# Patient Record
Sex: Female | Born: 1950 | Race: White | Hispanic: No | Marital: Married | Smoking: Never smoker
Health system: Southern US, Community
[De-identification: ages and names within clinical notes are randomized; demographics above are authoritative.]

## PROBLEM LIST (undated history)

## (undated) DIAGNOSIS — M797 Fibromyalgia: Secondary | ICD-10-CM

## (undated) DIAGNOSIS — T7840XA Allergy, unspecified, initial encounter: Secondary | ICD-10-CM

## (undated) DIAGNOSIS — J45909 Unspecified asthma, uncomplicated: Secondary | ICD-10-CM

## (undated) DIAGNOSIS — N393 Stress incontinence (female) (male): Secondary | ICD-10-CM

## (undated) DIAGNOSIS — N200 Calculus of kidney: Secondary | ICD-10-CM

## (undated) DIAGNOSIS — L719 Rosacea, unspecified: Secondary | ICD-10-CM

## (undated) DIAGNOSIS — E0500 Thyrotoxicosis with diffuse goiter without thyrotoxic crisis or storm: Secondary | ICD-10-CM

## (undated) DIAGNOSIS — I10 Essential (primary) hypertension: Secondary | ICD-10-CM

## (undated) DIAGNOSIS — E785 Hyperlipidemia, unspecified: Secondary | ICD-10-CM

## (undated) DIAGNOSIS — G2581 Restless legs syndrome: Secondary | ICD-10-CM

## (undated) DIAGNOSIS — K589 Irritable bowel syndrome without diarrhea: Secondary | ICD-10-CM

## (undated) HISTORY — DX: Stress incontinence (female) (male): N39.3

## (undated) HISTORY — DX: Rosacea, unspecified: L71.9

## (undated) HISTORY — DX: Irritable bowel syndrome, unspecified: K58.9

## (undated) HISTORY — PX: CHOLECYSTECTOMY: SHX55

## (undated) HISTORY — DX: Essential (primary) hypertension: I10

## (undated) HISTORY — DX: Allergy, unspecified, initial encounter: T78.40XA

## (undated) HISTORY — DX: Fibromyalgia: M79.7

## (undated) HISTORY — DX: Restless legs syndrome: G25.81

## (undated) HISTORY — DX: Thyrotoxicosis with diffuse goiter without thyrotoxic crisis or storm: E05.00

## (undated) HISTORY — DX: Hyperlipidemia, unspecified: E78.5

## (undated) HISTORY — DX: Unspecified asthma, uncomplicated: J45.909

## (undated) HISTORY — DX: Calculus of kidney: N20.0

---

## 1975-03-06 HISTORY — PX: ABDOMINAL HYSTERECTOMY: SHX81

## 1991-03-06 HISTORY — PX: NECK SURGERY: SHX720

## 1999-09-11 ENCOUNTER — Encounter: Payer: Self-pay | Admitting: Family Medicine

## 1999-09-11 ENCOUNTER — Encounter: Admission: RE | Admit: 1999-09-11 | Discharge: 1999-09-11 | Payer: Self-pay | Admitting: Family Medicine

## 2000-03-05 DIAGNOSIS — M797 Fibromyalgia: Secondary | ICD-10-CM

## 2000-03-05 HISTORY — DX: Fibromyalgia: M79.7

## 2002-11-02 ENCOUNTER — Encounter: Payer: Self-pay | Admitting: Family Medicine

## 2002-11-02 ENCOUNTER — Encounter: Admission: RE | Admit: 2002-11-02 | Discharge: 2002-11-02 | Payer: Self-pay | Admitting: Family Medicine

## 2004-03-05 HISTORY — PX: CARPAL TUNNEL RELEASE: SHX101

## 2009-04-08 ENCOUNTER — Encounter: Admission: RE | Admit: 2009-04-08 | Discharge: 2009-04-08 | Payer: Self-pay | Admitting: Family Medicine

## 2011-02-11 ENCOUNTER — Ambulatory Visit (INDEPENDENT_AMBULATORY_CARE_PROVIDER_SITE_OTHER): Payer: 59

## 2011-02-11 DIAGNOSIS — J9801 Acute bronchospasm: Secondary | ICD-10-CM

## 2011-02-11 DIAGNOSIS — J301 Allergic rhinitis due to pollen: Secondary | ICD-10-CM

## 2012-03-31 ENCOUNTER — Ambulatory Visit (INDEPENDENT_AMBULATORY_CARE_PROVIDER_SITE_OTHER): Payer: 59 | Admitting: Family Medicine

## 2012-03-31 VITALS — BP 128/84 | HR 98 | Temp 99.9°F | Resp 16 | Ht 65.0 in | Wt 215.0 lb

## 2012-03-31 DIAGNOSIS — J45909 Unspecified asthma, uncomplicated: Secondary | ICD-10-CM

## 2012-03-31 MED ORDER — BENZONATATE 200 MG PO CAPS: 200.0000 mg | ORAL_CAPSULE | Freq: Two times a day (BID) | ORAL | Status: DC | PRN

## 2012-03-31 MED ORDER — ALBUTEROL SULFATE HFA 108 (90 BASE) MCG/ACT IN AERS: 2.0000 | INHALATION_SPRAY | Freq: Four times a day (QID) | RESPIRATORY_TRACT | Status: DC | PRN

## 2012-03-31 MED ORDER — METHYLPREDNISOLONE 4 MG PO KIT: PACK | ORAL | Status: DC

## 2012-03-31 NOTE — Progress Notes (Signed)
62 yo woman with known asthma who has been experiencing a asthma flare for last 48 hours.  Patient has cough  And hoarseness.  Often  Can go 2-3 weeks without  Using the inhaler.  Works for [employer], has dog at home  Obj:  NAD, raspy voice. HEENT:  Upper plate, mild erythema; TM's neg NEck: supple, no adenopathy Chest:  Few faint wheezes  Assessment:  Asthmatic attack  1. Asthma  albuterol (PROVENTIL HFA;VENTOLIN HFA) 108 (90 BASE) MCG/ACT inhaler

## 2012-08-02 ENCOUNTER — Emergency Department: Payer: Self-pay | Admitting: Emergency Medicine

## 2012-08-02 LAB — CBC
HCT: 43.1 % (ref 35.0–47.0)
MCHC: 33.9 g/dL (ref 32.0–36.0)
MCV: 96 fL (ref 80–100)
RBC: 4.48 10*6/uL (ref 3.80–5.20)
RDW: 12.7 % (ref 11.5–14.5)
WBC: 8.7 10*3/uL (ref 3.6–11.0)

## 2012-08-02 LAB — URINALYSIS, COMPLETE: RBC,UR: 12 /HPF (ref 0–5)

## 2012-08-02 LAB — COMPREHENSIVE METABOLIC PANEL
Albumin: 3.6 g/dL (ref 3.4–5.0)
Alkaline Phosphatase: 68 U/L (ref 50–136)
BUN: 15 mg/dL (ref 7–18)
Bilirubin,Total: 0.3 mg/dL (ref 0.2–1.0)
Calcium, Total: 9.4 mg/dL (ref 8.5–10.1)
Chloride: 106 mmol/L (ref 98–107)
Co2: 28 mmol/L (ref 21–32)
Creatinine: 0.8 mg/dL (ref 0.60–1.30)
EGFR (African American): >60
EGFR (Non-African Amer.): >60
Glucose: 115 mg/dL — ABNORMAL HIGH (ref 65–99)
Osmolality: 283 (ref 275–301)
Potassium: 4.1 mmol/L (ref 3.5–5.1)
SGPT (ALT): 41 U/L (ref 12–78)
Sodium: 141 mmol/L (ref 136–145)
Total Protein: 7.8 g/dL (ref 6.4–8.2)

## 2012-08-02 LAB — LIPASE, BLOOD: Lipase: 131 U/L (ref 73–393)

## 2012-09-17 ENCOUNTER — Ambulatory Visit: Payer: Self-pay | Admitting: Surgery

## 2012-09-17 LAB — BASIC METABOLIC PANEL
BUN: 12 mg/dL (ref 7–18)
Calcium, Total: 9.4 mg/dL (ref 8.5–10.1)
Chloride: 106 mmol/L (ref 98–107)
Creatinine: 0.94 mg/dL (ref 0.60–1.30)
EGFR (African American): >60
EGFR (Non-African Amer.): >60
Glucose: 99 mg/dL (ref 65–99)
Osmolality: 281 (ref 275–301)
Potassium: 3.9 mmol/L (ref 3.5–5.1)

## 2012-09-17 LAB — CBC WITH DIFFERENTIAL/PLATELET
Basophil #: 0.1 10*3/uL (ref 0.0–0.1)
Eosinophil #: 0.2 10*3/uL (ref 0.0–0.7)
Eosinophil %: 2.9 %
HCT: 42 % (ref 35.0–47.0)
Lymphocyte #: 2 10*3/uL (ref 1.0–3.6)
Lymphocyte %: 35.9 %
MCHC: 34.8 g/dL (ref 32.0–36.0)
Monocyte #: 0.7 x10 3/mm (ref 0.2–0.9)
Neutrophil #: 2.7 10*3/uL (ref 1.4–6.5)
Neutrophil %: 47.7 %
RDW: 12.9 % (ref 11.5–14.5)
WBC: 5.6 10*3/uL (ref 3.6–11.0)

## 2012-09-17 LAB — HEPATIC FUNCTION PANEL A (ARMC)
Albumin: 3.5 g/dL (ref 3.4–5.0)
Alkaline Phosphatase: 72 U/L (ref 50–136)
Bilirubin, Direct: <0.1 mg/dL (ref 0.00–0.20)
Bilirubin,Total: 0.4 mg/dL (ref 0.2–1.0)
SGPT (ALT): 40 U/L (ref 12–78)

## 2012-09-18 LAB — PATHOLOGY REPORT

## 2013-07-12 ENCOUNTER — Ambulatory Visit: Payer: 59 | Admitting: Family Medicine

## 2013-07-12 ENCOUNTER — Ambulatory Visit: Payer: 59

## 2013-07-12 VITALS — BP 162/98 | HR 79 | Temp 98.1°F | Resp 15 | Ht 66.0 in | Wt 215.8 lb

## 2013-07-12 DIAGNOSIS — R0602 Shortness of breath: Secondary | ICD-10-CM

## 2013-07-12 DIAGNOSIS — J45901 Unspecified asthma with (acute) exacerbation: Secondary | ICD-10-CM

## 2013-07-12 DIAGNOSIS — J309 Allergic rhinitis, unspecified: Secondary | ICD-10-CM

## 2013-07-12 DIAGNOSIS — J45909 Unspecified asthma, uncomplicated: Secondary | ICD-10-CM

## 2013-07-12 LAB — POCT CBC
Granulocyte percent: 50.9 %G (ref 37–80)
HCT, POC: 45.7 % (ref 37.7–47.9)
Hemoglobin: 14.9 g/dL (ref 12.2–16.2)
Lymph, poc: 2.9 (ref 0.6–3.4)
MCH, POC: 32.5 pg — AB (ref 27–31.2)
MCHC: 32.6 g/dL (ref 31.8–35.4)
MCV: 99.8 fL — AB (ref 80–97)
MID (cbc): 0.6 (ref 0–0.9)
MPV: 9.6 fL (ref 0–99.8)
POC Granulocyte: 3.6 (ref 2–6.9)
POC LYMPH PERCENT: 40.3 %L (ref 10–50)
POC MID %: 8.8 %M (ref 0–12)
Platelet Count, POC: 246 10*3/uL (ref 142–424)
RBC: 4.58 M/uL (ref 4.04–5.48)
RDW, POC: 13.5 %
WBC: 7.1 10*3/uL (ref 4.6–10.2)

## 2013-07-12 MED ORDER — ALBUTEROL SULFATE (2.5 MG/3ML) 0.083% IN NEBU
2.5000 mg | INHALATION_SOLUTION | Freq: Once | RESPIRATORY_TRACT | Status: AC
  Administered 2013-07-12: 2.5 mg via RESPIRATORY_TRACT

## 2013-07-12 MED ORDER — FLUTICASONE PROPIONATE 50 MCG/ACT NA SUSP: 2.0000 | Freq: Every day | NASAL | Status: DC

## 2013-07-12 MED ORDER — METHYLPREDNISOLONE ACETATE 80 MG/ML IJ SUSP: 80.0000 mg | Freq: Once | INTRAMUSCULAR | Status: DC

## 2013-07-12 MED ORDER — IPRATROPIUM BROMIDE 0.02 % IN SOLN
0.5000 mg | Freq: Once | RESPIRATORY_TRACT | Status: AC
  Administered 2013-07-12: 0.5 mg via RESPIRATORY_TRACT

## 2013-07-12 MED ORDER — METHYLPREDNISOLONE ACETATE 80 MG/ML IJ SUSP
120.0000 mg | Freq: Once | INTRAMUSCULAR | Status: AC
  Administered 2013-07-12: 120 mg via INTRAMUSCULAR

## 2013-07-12 MED ORDER — MONTELUKAST SODIUM 10 MG PO TABS: 10.0000 mg | ORAL_TABLET | Freq: Every day | ORAL | Status: DC

## 2013-07-12 MED ORDER — AZITHROMYCIN 250 MG PO TABS: ORAL_TABLET | ORAL | Status: DC

## 2013-07-12 MED ORDER — BENZONATATE 200 MG PO CAPS: 200.0000 mg | ORAL_CAPSULE | Freq: Three times a day (TID) | ORAL | Status: DC | PRN

## 2013-07-12 MED ORDER — PREDNISONE 20 MG PO TABS: ORAL_TABLET | ORAL | Status: DC

## 2013-07-12 MED ORDER — ALBUTEROL SULFATE HFA 108 (90 BASE) MCG/ACT IN AERS: 2.0000 | INHALATION_SPRAY | Freq: Four times a day (QID) | RESPIRATORY_TRACT | Status: DC | PRN

## 2013-07-12 NOTE — Patient Instructions (Signed)
Asthma, Adult Asthma is a recurring condition in which the airways tighten and narrow. Asthma can make it difficult to breathe. It can cause coughing, wheezing, and shortness of breath. Asthma episodes (also called asthma attacks) range from minor to life-threatening. Asthma cannot be cured, but medicines and lifestyle changes can help control it. CAUSES Asthma is believed to be caused by inherited (genetic) and environmental factors, but its exact cause is unknown. Asthma may be triggered by allergens, lung infections, or irritants in the air. Asthma triggers are different for each person. Common triggers include:   Animal dander.  Dust mites.  Cockroaches.  Pollen from trees or grass.  Mold.  Smoke.  Air pollutants such as dust, household cleaners, hair sprays, aerosol sprays, paint fumes, strong chemicals, or strong odors.  Cold air, weather changes, and winds (which increase molds and pollens in the air).  Strong emotional expressions such as crying or laughing hard.  Stress.  Certain medicines (such as aspirin) or types of drugs (such as beta-blockers).  Sulfites in foods and drinks. Foods and drinks that may contain sulfites include dried fruit, potato chips, and sparkling grape juice.  Infections or inflammatory conditions such as the flu, a cold, or an inflammation of the nasal membranes (rhinitis).  Gastroesophageal reflux disease (GERD).  Exercise or strenuous activity. SYMPTOMS Symptoms may occur immediately after asthma is triggered or many hours later. Symptoms include:  Wheezing.  Excessive nighttime or early morning coughing.  Frequent or severe coughing with a common cold.  Chest tightness.  Shortness of breath. DIAGNOSIS  The diagnosis of asthma is made by a review of your medical history and a physical exam. Tests may also be performed. These may include:  Lung function studies. These tests show how much air you breath in and out.  Allergy  tests.  Imaging tests such as X-rays. TREATMENT  Asthma cannot be cured, but it can usually be controlled. Treatment involves identifying and avoiding your asthma triggers. It also involves medicines. There are 2 classes of medicine used for asthma treatment:   Controller medicines. These prevent asthma symptoms from occurring. They are usually taken every day.  Reliever or rescue medicines. These quickly relieve asthma symptoms. They are used as needed and provide short-term relief. Your health care provider will help you create an asthma action plan. An asthma action plan is a written plan for managing and treating your asthma attacks. It includes a list of your asthma triggers and how they may be avoided. It also includes information on when medicines should be taken and when their dosage should be changed. An action plan may also involve the use of a device called a peak flow meter. A peak flow meter measures how well the lungs are working. It helps you monitor your condition. HOME CARE INSTRUCTIONS   Take medicine as directed by your health care provider. Speak with your health care provider if you have questions about how or when to take the medicines.  Use a peak flow meter as directed by your health care provider. Record and keep track of readings.  Understand and use the action plan to help minimize or stop an asthma attack without needing to seek medical care.  Control your home environment in the following ways to help prevent asthma attacks:  Do not smoke. Avoid being exposed to secondhand smoke.  Change your heating and air conditioning filter regularly.  Limit your use of fireplaces and wood stoves.  Get rid of pests (such as roaches and   mice) and their droppings.  Throw away plants if you see mold on them.  Clean your floors and dust regularly. Use unscented cleaning products.  Try to have someone else vacuum for you regularly. Stay out of rooms while they are being  vacuumed and for a short while afterward. If you vacuum, use a dust mask from a hardware store, a double-layered or microfilter vacuum cleaner bag, or a vacuum cleaner with a HEPA filter.  Replace carpet with wood, tile, or vinyl flooring. Carpet can trap dander and dust.  Use allergy-proof pillows, mattress covers, and box spring covers.  Wash bed sheets and blankets every week in hot water and dry them in a dryer.  Use blankets that are made of polyester or cotton.  Clean bathrooms and kitchens with bleach. If possible, have someone repaint the walls in these rooms with mold-resistant paint. Keep out of the rooms that are being cleaned and painted.  Wash hands frequently. SEEK MEDICAL CARE IF:   You have wheezing, shortness of breath, or a cough even if taking medicine to prevent attacks.  The colored mucus you cough up (sputum) is thicker than usual.  Your sputum changes from clear or white to yellow, green, gray, or bloody.  You have any problems that may be related to the medicines you are taking (such as a rash, itching, swelling, or trouble breathing).  You are using a reliever medicine more than 2 3 times per week.  Your peak flow is still at 50 79% of you personal best after following your action plan for 1 hour. SEEK IMMEDIATE MEDICAL CARE IF:   You seem to be getting worse and are unresponsive to treatment during an asthma attack.  You are short of breath even at rest.  You get short of breath when doing very little physical activity.  You have difficulty eating, drinking, or talking due to asthma symptoms.  You develop chest pain.  You develop a fast heartbeat.  You have a bluish color to your lips or fingernails.  You are lightheaded, dizzy, or faint.  Your peak flow is less than 50% of your personal best.  You have a fever or persistent symptoms for more than 2 3 days.  You have a fever and symptoms suddenly get worse. MAKE SURE YOU:   Understand these  instructions.  Will watch your condition.  Will get help right away if you are not doing well or get worse. Document Released: 02/19/2005 Document Revised: 10/22/2012 Document Reviewed: 09/18/2012 [employer] Patient Information 2014 [employer].  

## 2013-07-12 NOTE — Progress Notes (Addendum)
Subjective:  This chart was scribed for Mindy SimmerKristi Fillmore Bynum, MD by Carl Bestelina Holson, Medical Scribe. This patient was seen in Room 2 and the patient's care was started at 10:08 AM.   Patient ID: Mindy Hood, female    DOB: 08/11/1950, 63 y.o.   MRN: [id number]  Asthma She complains of cough, shortness of breath and wheezing. Associated symptoms include ear pain, a fever, headaches and rhinorrhea. Her past medical history is significant for asthma.   HPI Comments: Mindy Hood is a 63 y.o. female who presents to the [facility] complaining of worsening asthmatic symptoms that started a four days ago.  The patient states that her symptoms started with rhinorrhea productive of clear mucous and itchy eyes.  She states that she went to an urgent care facility when her asthmatic symptoms started and was prescribed a seven day Prednisone taper.  She states that her symptoms have worsened since then and she is experiencing SOB and wheezing.  The patient states that she will experience headache when she starts coughing, tightness in her ears, and sore throat.  She states that she had a 99 degree fever.  She denies chills and diaphoresis as associated symptoms.  The patient states that she has taken 10 mg of Zyrtec for her allergies.  She denies using a nasal spray.  The patient states that her prescription for her albuterol inhaler has run out and she used her inhaler 5 times yesterday.  She states that she last used her inhaler at 4 AM this morning.  The patient states that non-fruit scented hand lotions, dust from construction, and cologne aggravate her allergies.  She denies having a history of smoking.  The patient denies having a history of Glaucoma.  She states that she works 12 hour days at [employer].  The patient's PCP is Dr. Kevan NyGates.   Past Medical History  Diagnosis Date  . Allergy   . Asthma   . Fibromyalgia 2002   Past Surgical History  Procedure Laterality Date  . Abdominal  hysterectomy    . Neck surgery  1993    2 disc surgery  . Carpal tunnel release  2006    right hand   Family History  Problem Relation Age of Onset  . Diverticulitis Sister   . Fibromyalgia Sister    History   Social History  . Marital Status: Married    Spouse Name: N/A    Number of Children: N/A  . Years of Education: N/A   Occupational History  . Not on file.   Social History Main Topics  . Smoking status: Never Smoker   . Smokeless tobacco: Not on file  . Alcohol Use: Yes     Comment: rarely  . Drug Use: No  . Sexual Activity: Not on file   Other Topics Concern  . Not on file   Social History Narrative  . No narrative on file   Allergies  Allergen Reactions  . Codeine Itching  . Penicillins Rash    Review of Systems  Constitutional: Positive for fever. Negative for chills and diaphoresis.  HENT: Positive for ear pain, rhinorrhea and voice change.   Eyes: Positive for itching.  Respiratory: Positive for cough, shortness of breath and wheezing.   Allergic/Immunologic: Positive for environmental allergies.  Neurological: Positive for headaches.  All other systems reviewed and are negative.    Objective:  Physical Exam  Constitutional: She is oriented to person, place, and time. She appears well-developed and  well-nourished. No distress.  HENT:  Head: Normocephalic and atraumatic.  Right Ear: Hearing, tympanic membrane, external ear and ear canal normal.  Left Ear: Hearing, tympanic membrane, external ear and ear canal normal.  Nose: Right sinus exhibits frontal sinus tenderness. Right sinus exhibits no maxillary sinus tenderness. Left sinus exhibits frontal sinus tenderness. Left sinus exhibits no maxillary sinus tenderness.  Mouth/Throat: Uvula is midline, oropharynx is clear and moist and mucous membranes are normal. No oropharyngeal exudate, posterior oropharyngeal edema or posterior oropharyngeal erythema.  Eyes: EOM are normal. Pupils are equal,  round, and reactive to light.  Conjunctiva diffusely erythematous.    Neck: Normal range of motion. Neck supple. No thyromegaly present.  Cardiovascular: Normal rate, regular rhythm, normal heart sounds and intact distal pulses.  Exam reveals no gallop and no friction rub.   No murmur heard. Pulmonary/Chest: Effort normal. No respiratory distress. She has wheezes. She has no rales.  Musculoskeletal: Normal range of motion.  Lymphadenopathy:    She has no cervical adenopathy.  Neurological: She is alert and oriented to person, place, and time.  Skin: Skin is warm and dry. No rash noted. She is not diaphoretic.  Psychiatric: She has a normal mood and affect. Her behavior is normal.  Nursing note and vitals reviewed.  Results for orders placed in visit on 07/12/13  POCT CBC      Result Value Ref Range   WBC 7.1  4.6 - 10.2 K/uL   Lymph, poc 2.9  0.6 - 3.4   POC LYMPH PERCENT 40.3  10 - 50 %L   MID (cbc) 0.6  0 - 0.9   POC MID % 8.8  0 - 12 %M   POC Granulocyte 3.6  2 - 6.9   Granulocyte percent 50.9  37 - 80 %G   RBC 4.58  4.04 - 5.48 M/uL   Hemoglobin 14.9  12.2 - 16.2 g/dL   HCT, POC 78.245.7  95.637.7 - 47.9 %   MCV 99.8 (*) 80 - 97 fL   MCH, POC 32.5 (*) 27 - 31.2 pg   MCHC 32.6  31.8 - 35.4 g/dL   RDW, POC 21.313.5     Platelet Count, POC 246  142 - 424 K/uL   MPV 9.6  0 - 99.8 fL   [facility] reading (PRIMARY) by  Dr. Katrinka BlazingSmith. CXR:  NAD.  ALBUTEROL/ATROVENT NEBULIZER ADMINISTERED.  DEPOMEDROL 120MG  IM ADMINISTERED.  PEAK FLOW PRE-NEB:  350 PEAK FLOW POST-NEB: 350   BP 162/98  Pulse 79  Temp(Src) 98.1 F (36.7 C) (Oral)  Resp 15  Ht 5\' 6"  (1.676 m)  Wt 215 lb 12.8 oz (97.886 kg)  BMI 34.85 kg/m2  SpO2 98% Assessment & Plan:  SOB (shortness of breath) - Plan: POCT CBC, DG Chest 2 View, albuterol (PROVENTIL) (2.5 MG/3ML) 0.083% nebulizer solution 2.5 mg, ipratropium (ATROVENT) nebulizer solution 0.5 mg, methylPREDNISolone acetate (DEPO-MEDROL) injection 120 mg, DISCONTINUED:  methylPREDNISolone acetate (DEPO-MEDROL) injection 80 mg  Asthma exacerbation - Plan: POCT CBC, DG Chest 2 View, albuterol (PROVENTIL) (2.5 MG/3ML) 0.083% nebulizer solution 2.5 mg, ipratropium (ATROVENT) nebulizer solution 0.5 mg, methylPREDNISolone acetate (DEPO-MEDROL) injection 120 mg, DISCONTINUED: methylPREDNISolone acetate (DEPO-MEDROL) injection 80 mg  Asthma - Plan: albuterol (PROVENTIL HFA;VENTOLIN HFA) 108 (90 BASE) MCG/ACT inhaler, benzonatate (TESSALON) 200 MG capsule  Allergic rhinitis, cause unspecified - Plan: POCT CBC, DG Chest 2 View, albuterol (PROVENTIL) (2.5 MG/3ML) 0.083% nebulizer solution 2.5 mg, ipratropium (ATROVENT) nebulizer solution 0.5 mg, DISCONTINUED: methylPREDNISolone acetate (DEPO-MEDROL) injection 80 mg   1.  SOB: New; secondary to asthma exacerbation; CXR normal. 2.  Asthma exacerbation:  New.  S/p nebulizer in office; s/p Depomedrol 120mg  IM in office; rx for Prednisone provided to continue; rx for Albuterol HFA provided to use qid scheduled for one week and then PRN.  RTC for acute worsening.  Rx for [employer] and Zithromax provided to cover any secondary infection. 3.  Allergic Rhinitis: uncontrolled; add Flonase and Singulair to oral antihistamine.   Meds ordered this encounter  Medications  . albuterol (PROVENTIL) (2.5 MG/3ML) 0.083% nebulizer solution 2.5 mg    Sig:   . ipratropium (ATROVENT) nebulizer solution 0.5 mg    Sig:   . DISCONTD: methylPREDNISolone acetate (DEPO-MEDROL) injection 80 mg    Sig:   . methylPREDNISolone acetate (DEPO-MEDROL) injection 120 mg    Sig:   . albuterol (PROVENTIL HFA;VENTOLIN HFA) 108 (90 BASE) MCG/ACT inhaler    Sig: Inhale 2 puffs into the lungs every 6 (six) hours as needed.    Dispense:  18 g    Refill:  11  . benzonatate (TESSALON) 200 MG capsule    Sig: Take 1 capsule (200 mg total) by mouth 3 (three) times daily as needed for cough.    Dispense:  60 capsule    Refill:  5  . fluticasone (FLONASE)  50 MCG/ACT nasal spray    Sig: Place 2 sprays into both nostrils daily.    Dispense:  16 g    Refill:  11  . montelukast (SINGULAIR) 10 MG tablet    Sig: Take 1 tablet (10 mg total) by mouth at bedtime.    Dispense:  30 tablet    Refill:  5  . predniSONE (DELTASONE) 20 MG tablet    Sig: Three tablets daily x 1 day then two tablets daily x 5 days then one tablet daily x 3 days    Dispense:  16 tablet    Refill:  0  . azithromycin (ZITHROMAX) 250 MG tablet    Sig: Two tablets daily x 1 day then one tablet daily x 4 days    Dispense:  6 tablet    Refill:  0   I personally performed the services described in this documentation, which was scribed in my presence.  The recorded information has been reviewed and is accurate.  Mindy Simmer, M.D.  [facility] [address] [place], [place]  [address] [phone] phone [phone] fax

## 2013-07-26 ENCOUNTER — Emergency Department: Payer: Self-pay | Admitting: Emergency Medicine

## 2013-07-26 LAB — URINALYSIS, COMPLETE
Bacteria: NONE SEEN
Bilirubin,UR: NEGATIVE
GLUCOSE, UR: NEGATIVE mg/dL (ref 0–75)
Ketone: NEGATIVE
Nitrite: NEGATIVE
Ph: 5 (ref 4.5–8.0)
Protein: 30
RBC,UR: 299 /HPF (ref 0–5)
SPECIFIC GRAVITY: 1.027 (ref 1.003–1.030)
WBC UR: 20 /HPF (ref 0–5)

## 2013-07-26 LAB — COMPREHENSIVE METABOLIC PANEL
ALBUMIN: 3.5 g/dL (ref 3.4–5.0)
Alkaline Phosphatase: 67 U/L
Anion Gap: 8 (ref 7–16)
BILIRUBIN TOTAL: 0.5 mg/dL (ref 0.2–1.0)
BUN: 18 mg/dL (ref 7–18)
CHLORIDE: 102 mmol/L (ref 98–107)
Calcium, Total: 9.6 mg/dL (ref 8.5–10.1)
Co2: 28 mmol/L (ref 21–32)
Creatinine: 1.04 mg/dL (ref 0.60–1.30)
EGFR (African American): >60
EGFR (Non-African Amer.): 58 — ABNORMAL LOW
GLUCOSE: 134 mg/dL — AB (ref 65–99)
OSMOLALITY: 280 (ref 275–301)
Potassium: 4.4 mmol/L (ref 3.5–5.1)
SGOT(AST): 23 U/L (ref 15–37)
SGPT (ALT): 50 U/L (ref 12–78)
Sodium: 138 mmol/L (ref 136–145)
Total Protein: 7.7 g/dL (ref 6.4–8.2)

## 2013-07-26 LAB — CBC
HCT: 45 % (ref 35.0–47.0)
HGB: 14.9 g/dL (ref 12.0–16.0)
MCH: 32.5 pg (ref 26.0–34.0)
MCHC: 33 g/dL (ref 32.0–36.0)
MCV: 98 fL (ref 80–100)
PLATELETS: 221 10*3/uL (ref 150–440)
RBC: 4.58 10*6/uL (ref 3.80–5.20)
RDW: 13.1 % (ref 11.5–14.5)
WBC: 9.8 10*3/uL (ref 3.6–11.0)

## 2014-02-22 ENCOUNTER — Ambulatory Visit (INDEPENDENT_AMBULATORY_CARE_PROVIDER_SITE_OTHER): Payer: 59 | Admitting: Physician Assistant

## 2014-02-22 VITALS — BP 148/102 | HR 97 | Temp 98.7°F | Resp 17 | Ht 66.5 in | Wt 218.0 lb

## 2014-02-22 DIAGNOSIS — Z889 Allergy status to unspecified drugs, medicaments and biological substances status: Secondary | ICD-10-CM

## 2014-02-22 DIAGNOSIS — R0602 Shortness of breath: Secondary | ICD-10-CM

## 2014-02-22 DIAGNOSIS — J4540 Moderate persistent asthma, uncomplicated: Secondary | ICD-10-CM

## 2014-02-22 DIAGNOSIS — R05 Cough: Secondary | ICD-10-CM

## 2014-02-22 DIAGNOSIS — R0981 Nasal congestion: Secondary | ICD-10-CM

## 2014-02-22 DIAGNOSIS — R059 Cough, unspecified: Secondary | ICD-10-CM

## 2014-02-22 DIAGNOSIS — Z9109 Other allergy status, other than to drugs and biological substances: Secondary | ICD-10-CM

## 2014-02-22 DIAGNOSIS — J4530 Mild persistent asthma, uncomplicated: Secondary | ICD-10-CM

## 2014-02-22 MED ORDER — BENZONATATE 200 MG PO CAPS: 200.0000 mg | ORAL_CAPSULE | Freq: Three times a day (TID) | ORAL | Status: DC | PRN

## 2014-02-22 MED ORDER — LEVALBUTEROL HCL 0.63 MG/3ML IN NEBU
0.6300 mg | INHALATION_SOLUTION | Freq: Once | RESPIRATORY_TRACT | Status: AC
  Administered 2014-02-22: 0.63 mg via RESPIRATORY_TRACT

## 2014-02-22 MED ORDER — CETIRIZINE HCL 10 MG PO TABS: 10.0000 mg | ORAL_TABLET | Freq: Every day | ORAL | Status: DC

## 2014-02-22 MED ORDER — EPINEPHRINE 0.3 MG/0.3ML IJ SOAJ: 0.3000 mg | Freq: Once | INTRAMUSCULAR | Status: DC

## 2014-02-22 MED ORDER — ALBUTEROL SULFATE HFA 108 (90 BASE) MCG/ACT IN AERS: 2.0000 | INHALATION_SPRAY | Freq: Four times a day (QID) | RESPIRATORY_TRACT | Status: DC | PRN

## 2014-02-22 MED ORDER — BECLOMETHASONE DIPROPIONATE 40 MCG/ACT IN AERS: 2.0000 | INHALATION_SPRAY | Freq: Two times a day (BID) | RESPIRATORY_TRACT | Status: DC

## 2014-02-22 MED ORDER — MUCINEX DM MAXIMUM STRENGTH 60-1200 MG PO TB12: 1.0000 | ORAL_TABLET | Freq: Two times a day (BID) | ORAL | Status: DC

## 2014-02-22 NOTE — Progress Notes (Signed)
MRN: [id number] DOB: 05/24/1950  Subjective:    Chief Complaint  Patient presents with  . Shortness of Breath  . Asthma    Mindy Hood is a 63 y.o. female presenting for 8 days one week of sob.  Exacerbated around early December/January.  She is not sure what started the sob.  She has not had upper respiratory symptoms of ear fullness, sorethroat (besides yesterday her throat was scratchy), sinus pressure, or fever.  She has no known sick contacts.  She has had associated coughing that she is taking benzonatate for relief, but at nighttime, her cough she will wake up with a cough.  Husband smokes only outdoors, and they use separate cars.  She states that this sob with cough has appeared every year for the last 3 years and may lasts into July.   Allergic triggers range from cologne, dust, lotions, nuts that cause her airway to close up and she must escape to the outdoors and wait for her airways to open.  This astma has not been followed by a pcp in years, and she mostly appears for complaints.  She states that she heats the home with a propane gas furnace.  She has a air purifier.  Mindy Hood has a current medication list which includes the following prescription(s): albuterol, benzonatate, and cetirizine.  She is allergic to codeine; penicillins; and phenergan.  Mindy Hood  has a past medical history of Allergy; Asthma; and Fibromyalgia (2002). Also  has past surgical history that includes Abdominal hysterectomy; Neck surgery (1993); and Carpal tunnel release (2006).  ROS As in subjective.  Objective:   Vitals: BP 148/102 mmHg  Pulse 97  Temp(Src) 98.7 F (37.1 C) (Oral)  Resp 17  Ht 5' 6.5" (1.689 m)  Wt 218 lb (98.884 kg)  BMI 34.66 kg/m2  SpO2 97%  Physical Exam  Constitutional: She is oriented to person, place, and time and well-developed, well-nourished, and in no distress. No distress.  HENT:  Head: Normocephalic and atraumatic. Head is without raccoon's eyes.  Right Ear:  Tympanic membrane, external ear and ear canal normal.  Left Ear: Tympanic membrane, external ear and ear canal normal.  Nose: Mucosal edema and rhinorrhea present.  Mouth/Throat: No uvula swelling. No oropharyngeal exudate, posterior oropharyngeal edema or posterior oropharyngeal erythema.  Pulmonary/Chest: Effort normal. No accessory muscle usage. No apnea. No respiratory distress. She has decreased breath sounds (slightly decreased breath sounds in the lower lobes). She has no wheezes. She has no rhonchi.  Lymphadenopathy:       Head (right side): No submental, no submandibular, no preauricular, no posterior auricular and no occipital adenopathy present.       Head (left side): No submental, no submandibular, no preauricular, no posterior auricular and no occipital adenopathy present.       Right cervical: No superficial cervical and no posterior cervical adenopathy present.      Left cervical: No superficial cervical and no posterior cervical adenopathy present.       Right: No supraclavicular adenopathy present.       Left: No supraclavicular adenopathy present.  Neurological: She is oriented to person, place, and time.  Psychiatric: Mood, memory, affect and judgment normal.    Prediction:441  Pre-nebulizer treatment: 397 (90%) Post-nebulizer treatment: 440   Post-nebulizer: O2 Sat 98%     Assessment and Plan :  63 year old female is here today for sob and cough. Allergies are not well controlled, and exacerbating her asthma.  Increasing her daily zyrtec to  10mg .   She is also in need of increasing her asthma regimen to daily maintenance along with symptomatic inhaler.  Starting the qvar.  She will rtc in 1 month and establish care with pcp here, as well as follow up.  Due to her hx of acute allergic response to triggers, I am ordering her an epipen.    SOB (shortness of breath) - Plan: levalbuterol (XOPENEX) nebulizer solution 0.63 mg, beclomethasone (QVAR) 40 MCG/ACT  inhaler  Nasal congestion - Plan: Dextromethorphan-Guaifenesin (MUCINEX DM MAXIMUM STRENGTH) 60-1200 MG TB12  Cough - Plan: Dextromethorphan-Guaifenesin (MUCINEX DM MAXIMUM STRENGTH) 60-1200 MG TB12  Asthma, moderate persistent, uncomplicated - Plan: benzonatate (TESSALON) 200 MG capsule  Multiple allergies - Plan: cetirizine (ZYRTEC) 10 MG tablet, EPINEPHrine 0.3 mg/0.3 mL IJ SOAJ injection  Asthma, mild persistent, uncomplicated - Plan: albuterol (PROVENTIL HFA;VENTOLIN HFA) 108 (90 BASE) MCG/ACT inhaler  [name], PA-C [facility] 12/21/20159:31 PM

## 2014-02-22 NOTE — Patient Instructions (Addendum)
Do the qvar 2 puffs, twice per day.  Take the albuterol when your symptoms of shortness of breath do not resolve.  You can continue taking the flonase, until the nasal congestion resolves.   Change to taking the zyrtec at 10mg  dosage Recheck your BP at home, and let us know what it is when you return in 4 weeks. Please return if the shortness of breath is not helped with this regimen.    Epinephrine injection (Auto-injector) What is this medicine?  (ep i NEF rin) is used for the emergency treatment of severe allergic reactions. You should keep this medicine with you at all times. This medicine may be used for other purposes; ask your health care provider or pharmacist if you have questions. COMMON BRAND NAME(S): Adrenaclick, Auvi-Q, EpiPen, Twinject What should I tell my health care provider before I take this medicine? They need to know if you have any of the following conditions: -diabetes -heart disease -high blood pressure -lung or breathing disease, like asthma -Parkinson's disease -thyroid disease -an unusual or allergic reaction to epinephrine, sulfites, other medicines, foods, dyes, or preservatives -pregnant or trying to get pregnant -breast-feeding How should I use this medicine? This medicine is for injection into the outer thigh. Your doctor or health care professional will instruct you on the proper use of the device during an emergency. Read all directions carefully and make sure you understand them. Do not use more often than directed. Talk to your pediatrician regarding the use of this medicine in children. Special care may be needed. This drug is commonly used in children. A special device is available for use in children. Overdosage: If you think you have taken too much of this medicine contact a poison control center or emergency room at once. NOTE: This medicine is only for you. Do not share this medicine with others. What if I miss a dose? This does not apply. You  should only use this medicine for an allergic reaction. What may interact with this medicine? This medicine is only used during an emergency. Significant drug interactions are not likely during emergency use. This list may not describe all possible interactions. Give your health care provider a list of all the medicines, herbs, non-prescription drugs, or dietary supplements you use. Also tell them if you smoke, drink alcohol, or use illegal drugs. Some items may interact with your medicine. What should I watch for while using this medicine? Keep this medicine ready for use in the case of a severe allergic reaction. Make sure that you have the phone number of your doctor or health care professional and local hospital ready. Remember to check the expiration date of your medicine regularly. You may need to have additional units of this medicine with you at work, school, or other places. Talk to your doctor or health care professional about your need for extra units. Some emergencies may require an additional dose. Check with your doctor or a health care professional before using an extra dose. After use, go to the nearest hospital or call 911. Avoid physical activity. Make sure the treating health care professional knows you have received an injection of this medicine. You will receive additional instructions on what to do during and after use of this medicine before a medical emergency occurs. What side effects may I notice from receiving this medicine? Side effects that you should report to your doctor or health care professional as soon as possible: -allergic reactions like skin rash, itching or hives, swelling of  the face, lips, or tongue -breathing problems -chest pain -flushing -irregular or pounding heartbeat -numbness in fingers or toes -vomiting Side effects that usually do not require medical attention (report to your doctor or health care professional if they continue or are  bothersome): -anxiety or nervousness -dizzy, drowsy -dry mouth -headache -increased sweating -nausea -tired, weak This list may not describe all possible side effects. Call your doctor for medical advice about side effects. You may report side effects to FDA at 1-800-FDA-1088. Where should I keep my medicine? Keep out of the reach of children. Store at room temperature between 15 and 30 degrees C (59 and 86 degrees F). Protect from light and heat. The solution should be clear in color. If the solution is discolored or contains particles it must be replaced. Throw away any unused medicine after the expiration date. Ask your doctor or pharmacist about proper disposal of the injector if it is expired or has been used. Always replace your auto-injector before it expires. NOTE: This sheet is a summary. It may not cover all possible information. If you have questions about this medicine, talk to your doctor, pharmacist, or health care provider.  2015, [employer]. (2012-06-30 14:59:01)

## 2014-03-22 ENCOUNTER — Ambulatory Visit (INDEPENDENT_AMBULATORY_CARE_PROVIDER_SITE_OTHER): Payer: 59 | Admitting: Family Medicine

## 2014-03-22 ENCOUNTER — Encounter: Payer: Self-pay | Admitting: Family Medicine

## 2014-03-22 VITALS — BP 168/87 | HR 92 | Temp 98.3°F | Resp 16 | Ht 65.5 in | Wt 220.0 lb

## 2014-03-22 DIAGNOSIS — J45909 Unspecified asthma, uncomplicated: Secondary | ICD-10-CM | POA: Insufficient documentation

## 2014-03-22 DIAGNOSIS — L719 Rosacea, unspecified: Secondary | ICD-10-CM

## 2014-03-22 DIAGNOSIS — I10 Essential (primary) hypertension: Secondary | ICD-10-CM | POA: Insufficient documentation

## 2014-03-22 DIAGNOSIS — J302 Other seasonal allergic rhinitis: Secondary | ICD-10-CM

## 2014-03-22 DIAGNOSIS — E785 Hyperlipidemia, unspecified: Secondary | ICD-10-CM

## 2014-03-22 DIAGNOSIS — M797 Fibromyalgia: Secondary | ICD-10-CM

## 2014-03-22 DIAGNOSIS — G2581 Restless legs syndrome: Secondary | ICD-10-CM

## 2014-03-22 LAB — CBC WITH DIFFERENTIAL/PLATELET
BASOS PCT: 1 % (ref 0–1)
Basophils Absolute: 0.1 10*3/uL (ref 0.0–0.1)
EOS ABS: 0.2 10*3/uL (ref 0.0–0.7)
Eosinophils Relative: 3 % (ref 0–5)
HEMATOCRIT: 40.7 % (ref 36.0–46.0)
Hemoglobin: 14.1 g/dL (ref 12.0–15.0)
Lymphocytes Relative: 33 % (ref 12–46)
Lymphs Abs: 2.1 10*3/uL (ref 0.7–4.0)
MCH: 32 pg (ref 26.0–34.0)
MCHC: 34.6 g/dL (ref 30.0–36.0)
MCV: 92.5 fL (ref 78.0–100.0)
MONO ABS: 0.9 10*3/uL (ref 0.1–1.0)
MONOS PCT: 14 % — AB (ref 3–12)
MPV: 10.7 fL (ref 8.6–12.4)
Neutro Abs: 3.2 10*3/uL (ref 1.7–7.7)
Neutrophils Relative %: 49 % (ref 43–77)
Platelets: 260 10*3/uL (ref 150–400)
RBC: 4.4 MIL/uL (ref 3.87–5.11)
RDW: 13.1 % (ref 11.5–15.5)
WBC: 6.5 10*3/uL (ref 4.0–10.5)

## 2014-03-22 LAB — POCT URINALYSIS DIPSTICK
Bilirubin, UA: NEGATIVE
Glucose, UA: NEGATIVE
KETONES UA: NEGATIVE
Nitrite, UA: NEGATIVE
PH UA: 5.5
Protein, UA: NEGATIVE
Spec Grav, UA: 1.015
UROBILINOGEN UA: 0.2

## 2014-03-22 LAB — COMPREHENSIVE METABOLIC PANEL
ALT: 28 U/L (ref 0–35)
AST: 25 U/L (ref 0–37)
Albumin: 3.8 g/dL (ref 3.5–5.2)
Alkaline Phosphatase: 58 U/L (ref 39–117)
BUN: 12 mg/dL (ref 6–23)
CO2: 27 mEq/L (ref 19–32)
Calcium: 9.3 mg/dL (ref 8.4–10.5)
Chloride: 102 mEq/L (ref 96–112)
Creat: 0.96 mg/dL (ref 0.50–1.10)
GLUCOSE: 90 mg/dL (ref 70–99)
Potassium: 4.7 mEq/L (ref 3.5–5.3)
Sodium: 139 mEq/L (ref 135–145)
Total Bilirubin: 0.4 mg/dL (ref 0.2–1.2)
Total Protein: 7.1 g/dL (ref 6.0–8.3)

## 2014-03-22 LAB — TSH: TSH: 1.12 u[IU]/mL (ref 0.350–4.500)

## 2014-03-22 MED ORDER — CYCLOBENZAPRINE HCL 5 MG PO TABS: 5.0000 mg | ORAL_TABLET | Freq: Every day | ORAL | Status: DC

## 2014-03-22 MED ORDER — DOXYCYCLINE HYCLATE 100 MG PO CAPS: 100.0000 mg | ORAL_CAPSULE | Freq: Two times a day (BID) | ORAL | Status: DC

## 2014-03-22 MED ORDER — NIFEDIPINE ER OSMOTIC RELEASE 30 MG PO TB24: 30.0000 mg | ORAL_TABLET | Freq: Every day | ORAL | Status: DC

## 2014-03-22 NOTE — Progress Notes (Signed)
Subjective:    Patient ID: Mindy Hood, female    DOB: 1950/06/28, 64 y.o.   MRN: [id number]  03/22/2014  Asthma   HPI This 64 y.o. female presents for one month follow-up of asthma.  Management changes made at last visit included adding QVAR and Zyrtec.  Very similar symptoms every December for the past three years.  Usually occurs every December.  Has a propane heater. Bought humidifier with great improvement. Used QVAR dail yfor two weeks with improvement. Now taking Zyrtec  daily which is double the dose that patient was taking.  Was using Flonase but does not need. [place] andhand lotions are bad.    Elevated blood pressure:  Diagnosed with HTN years ago; "my whole life". Took Procardia for years with good results.  Moved to [place] and stopped medication.  [place] tried several medications with side effects.   Prefers Procardia which she has always tolerated well.  Denies CP/palp/SOB other than related to asthma.  Chronic lower extremity edema that worsens at end of the day.  Fibromyalgia: previous use of Celebrex.  Cannot lift three pounds.Diagnosed with Lymes disease in past.  Took Celebrex bid for years. Stopped medications cold [place] in 2005.  Treated with Doxycycline for 30 days.  Has standing rx for Doxycycline.   Chronic lower back pain; no recent ortho evaluation in past five years; extensive work up in 2002.  Suffers with horrible stiffness after prolonged sitting.  Current position since July 2015.  Moved home in 11/2013.    Hyperlipidemia: cannot tolerate statins.    Obesity:  Previously weighed 300 pounds.  Very happy with weight though realizes that she is still obese.  Rosacea: takes Doxycycline for flare ups; facial involvement mostly.  RLS: diagnosed years ago with fibromyalgia; takes Flexeril  qhs PRN.   Last physical: 2013 Pap smear:  Never; hysterectomy 1977; vaginal tumor; ovaries intact. Mammogram:  2013 Colonoscopy: never Bone density:   2013; no osteoporosis. Lost two inches in height. TDAP:  2013 Pneumovax:  never Zostavax:  never Influenza:   refuses Eye exam:   +glasses; yearly in March; no glaucoma or cataracts [facility].   Dental exam:  Retired; few teeth; [place].   Review of Systems  Constitutional: Negative for fever, chills, diaphoresis and fatigue.  HENT: Positive for congestion. Negative for ear pain, postnasal drip, rhinorrhea, sinus pressure and sore throat.   Eyes: Negative for visual disturbance.  Respiratory: Negative for cough and shortness of breath.   Cardiovascular: Negative for chest pain, palpitations and leg swelling.  Gastrointestinal: Negative for nausea, vomiting, abdominal pain, diarrhea and constipation.  Endocrine: Negative for cold intolerance, heat intolerance, polydipsia, polyphagia and polyuria.  Neurological: Negative for dizziness, tremors, seizures, syncope, facial asymmetry, speech difficulty, weakness, light-headedness, numbness and headaches.    Past Medical History  Diagnosis Date  . Allergy   . Asthma   . Hypertension   . Hyperlipidemia   . Fibromyalgia 2002    treated for Lyme's disease in 2001.  [place] Rosacea   . Restless leg syndrome     Cyclobenzaprine PRN.  . Nephrolithiasis   . Urinary, incontinence, stress female    Past Surgical History  Procedure Laterality Date  . Neck surgery  1993    2 disc surgery  . Carpal tunnel release  2006    right hand  . Abdominal hysterectomy  03/06/1975    ovaries intact.  Vaginal/uterine mass benign with hemorrhage.  . Cholecystectomy     Allergies  Allergen Reactions  . Codeine Itching  . Penicillins Rash  . Phenergan [Promethazine Hcl] Rash   Current Outpatient Prescriptions  Medication Sig Dispense Refill  . albuterol (PROVENTIL HFA;VENTOLIN HFA) 108 (90 BASE) MCG/ACT inhaler Inhale 2 puffs into the lungs every 6 (six) hours as needed. 18 g 11  . beclomethasone (QVAR) 40 MCG/ACT inhaler Inhale 2 puffs into the  lungs 2 (two) times daily. 1 Inhaler 12  . cetirizine (ZYRTEC) 10 MG tablet Take 1 tablet (10 mg total) by mouth daily. 30 tablet 11  . cyclobenzaprine (FLEXERIL) 5 MG tablet Take 1 tablet (5 mg total) by mouth at bedtime. 30 tablet 3  . doxycycline (VIBRAMYCIN) 100 MG capsule Take 1 capsule (100 mg total) by mouth 2 (two) times daily. 60 capsule 3  . EPINEPHrine 0.3 mg/0.3 mL IJ SOAJ injection Inject 0.3 mLs (0.3 mg total) into the muscle once. (Patient not taking: Reported on 03/22/2014) 1 Device 1  . NIFEdipine (PROCARDIA-XL/ADALAT-CC/NIFEDICAL-XL) 30 MG 24 hr tablet Take 1 tablet (30 mg total) by mouth daily. 30 tablet 5   No current facility-administered medications for this visit.   History   Social History  . Marital Status: Married    Spouse Hood: N/A    Number of Children: N/A  . Years of Education: N/A   Occupational History  . Not on file.   Social History Main Topics  . Smoking status: Never Smoker   . Smokeless tobacco: Not on file  . Alcohol Use: Yes     Comment: rarely  . Drug Use: No  . Sexual Activity: Not on file   Other Topics Concern  . Not on file   Social History Narrative   Marital status: married x  16 years; second marriage; husband is bipolar and alcoholic.         Children: 1 son 22(41); 2 granddaughters but has no relationship with them.      Lives: with husband      Employment:  Works from home full time. 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      Tobacco: none      Alcohol: none      Exercise: none; no exercise since 2015.       Family History  Problem Relation Age of Onset  . Diverticulitis Sister   . Hyperlipidemia Sister   . Hypertension Sister   . Fibromyalgia Sister   . Hypertension Sister   . Hyperlipidemia Sister   . Hypertension Mother   . Hyperlipidemia Mother   . Alcohol abuse Mother   . Cancer Mother     tongue/oral, throat, bladder cancer with mets  . Alcohol abuse Father   . Heart disease Father [id number]    AMI  s/p CABG  . Cirrhosis Father         Objective:    BP 168/87 mmHg  Pulse 92  Temp(Src) 98.3 F (36.8 C)  Resp 16  Ht 5' 5.5" (1.664 m)  Wt 220 lb (99.791 kg)  BMI 36.04 kg/m2  SpO2 96%  PF 450 L/min Physical Exam  Constitutional: She is oriented to person, place, and time. She appears well-developed and well-nourished. No distress.  HENT:  Head: Normocephalic and atraumatic.  Right Ear: External ear normal.  Left Ear: External ear normal.  Nose: Nose normal.  Mouth/Throat: Oropharynx is clear and moist.  Eyes: Conjunctivae and EOM are normal. Pupils are equal, round, and reactive to light.  Neck: Normal range of motion. Neck supple. Carotid bruit  is not present. No thyromegaly present.  Cardiovascular: Normal rate, regular rhythm, normal heart sounds and intact distal pulses.  Exam reveals no gallop and no friction rub.   No murmur heard. Pulmonary/Chest: Effort normal and breath sounds normal. She has no wheezes. She has no rales.  Abdominal: Soft. Bowel sounds are normal. She exhibits no distension and no mass. There is no tenderness. There is no rebound and no guarding.  Lymphadenopathy:    She has no cervical adenopathy.  Neurological: She is alert and oriented to person, place, and time. No cranial nerve deficit.  Skin: Skin is warm and dry. No rash noted. She is not diaphoretic. No erythema. No pallor.  Psychiatric: She has a normal mood and affect. Her behavior is normal.   Results for orders placed or performed in visit on 03/22/14  POCT urinalysis dipstick  Result Value Ref Range   Color, UA yellow    Clarity, UA hazy    Glucose, UA neg    Bilirubin, UA neg    Ketones, UA neg    Spec Grav, UA 1.015    Blood, UA trace-lysed    pH, UA 5.5    Protein, UA neg    Urobilinogen, UA 0.2    Nitrite, UA neg    Leukocytes, UA small (1+)    SPIROMETRY:  NORMAL.  FVC 100%, FEV1 100%.   PEAK FLOWS: 450.    Assessment & Plan:   1. Asthma, unspecified asthma  severity, uncomplicated   2. Essential hypertension, benign   3. Fibromyalgia   4. Hyperlipidemia   5. Other seasonal allergic rhinitis   6. Rosacea     1. Asthma mild persistent: Improved with QVAR, Zyrtec.  Has weaned off of daily QVAR at this time.  Refuses flu vaccine. 2.  HTN: uncontrolled; obtain u/a, labs; pt intolerant to multiple medications in the past but has tolerated Procardia well. Rx for Procardia XL  daily provided. RTC 2 months for follow-up. 3.  Hyperlipidemia: uncontrolled; intolerant to statins.  Obtain labs next visit at CPE. 4.  Allergic Rhinitis: controlled with daily year-round Zyrtec  daily and PRN Flonase. 5.  Fibromyalgia: stable; refill of Flexeril  qhs provided.  Struggling to sit prolonged in chair during work due to chronic lower back pain.  History of Lyme's disease s/p Doxy therapy. 6.  Rosacea:  Stable; refill of Doxycycline provided. 7. RLS: stable; refill of Flexeril  qhs provided.     Meds ordered this encounter  Medications  . NIFEdipine (PROCARDIA-XL/ADALAT-CC/NIFEDICAL-XL) 30 MG 24 hr tablet    Sig: Take 1 tablet (30 mg total) by mouth daily.    Dispense:  30 tablet    Refill:  5  . doxycycline (VIBRAMYCIN) 100 MG capsule    Sig: Take 1 capsule (100 mg total) by mouth 2 (two) times daily.    Dispense:  60 capsule    Refill:  3  . cyclobenzaprine (FLEXERIL) 5 MG tablet    Sig: Take 1 tablet (5 mg total) by mouth at bedtime.    Dispense:  30 tablet    Refill:  3    Return in about 2 months (around 05/21/2014) for complete physical examiniation.    Mele Sylvester Paulita Fujita, M.D. [facility] [address] [place], [place]  [address] [phone] phone [phone] fax

## 2014-03-22 NOTE — Patient Instructions (Signed)

## 2014-05-26 ENCOUNTER — Ambulatory Visit (INDEPENDENT_AMBULATORY_CARE_PROVIDER_SITE_OTHER): Payer: 59 | Admitting: Family Medicine

## 2014-05-26 ENCOUNTER — Encounter: Payer: Self-pay | Admitting: Family Medicine

## 2014-05-26 VITALS — BP 160/92 | HR 99 | Temp 98.3°F | Resp 16 | Ht 66.0 in | Wt 218.0 lb

## 2014-05-26 DIAGNOSIS — Z131 Encounter for screening for diabetes mellitus: Secondary | ICD-10-CM | POA: Diagnosis not present

## 2014-05-26 DIAGNOSIS — I10 Essential (primary) hypertension: Secondary | ICD-10-CM

## 2014-05-26 DIAGNOSIS — Z1211 Encounter for screening for malignant neoplasm of colon: Secondary | ICD-10-CM | POA: Diagnosis not present

## 2014-05-26 DIAGNOSIS — M797 Fibromyalgia: Secondary | ICD-10-CM | POA: Diagnosis not present

## 2014-05-26 DIAGNOSIS — Z1239 Encounter for other screening for malignant neoplasm of breast: Secondary | ICD-10-CM

## 2014-05-26 DIAGNOSIS — E785 Hyperlipidemia, unspecified: Secondary | ICD-10-CM | POA: Diagnosis not present

## 2014-05-26 DIAGNOSIS — J45909 Unspecified asthma, uncomplicated: Secondary | ICD-10-CM

## 2014-05-26 DIAGNOSIS — Z0000 Encounter for general adult medical examination without abnormal findings: Secondary | ICD-10-CM

## 2014-05-26 DIAGNOSIS — J302 Other seasonal allergic rhinitis: Secondary | ICD-10-CM

## 2014-05-26 LAB — POCT URINALYSIS DIPSTICK
BILIRUBIN UA: NEGATIVE
GLUCOSE UA: NEGATIVE
Ketones, UA: NEGATIVE
NITRITE UA: NEGATIVE
SPEC GRAV UA: 1.015
Urobilinogen, UA: 0.2
pH, UA: 5.5

## 2014-05-26 LAB — LIPID PANEL
Cholesterol: 228 mg/dL — ABNORMAL HIGH (ref 0–200)
HDL: 43 mg/dL — AB (ref >=46)
LDL CALC: 146 mg/dL — AB (ref 0–99)
TRIGLYCERIDES: 195 mg/dL — AB (ref <150)
Total CHOL/HDL Ratio: 5.3 Ratio
VLDL: 39 mg/dL (ref 0–40)

## 2014-05-26 MED ORDER — ZOSTER VACCINE LIVE 19400 UNT/0.65ML ~~LOC~~ SOLR: 0.6500 mL | Freq: Once | SUBCUTANEOUS | Status: DC

## 2014-05-26 MED ORDER — NIFEDIPINE ER OSMOTIC RELEASE 60 MG PO TB24: 60.0000 mg | ORAL_TABLET | Freq: Every day | ORAL | Status: DC

## 2014-05-26 NOTE — Patient Instructions (Signed)

## 2014-05-26 NOTE — Progress Notes (Signed)
Subjective:    Patient ID: Mindy Hood, female    DOB: [birth year], 64 y.o.   MRN: [id number]  05/26/2014  Annual Exam   HPI This 64 y.o. female presents for Complete Physical Examination.  Last physical:  2013 Pap smear:  Never; hysterectomy 1977; vaginal tumor; ovaries intact. Mammogram:  2013 Colonoscopy:  Never; refuses.   Bone density:  2013; no osteoporosis.   TDAP:  04/03/2010 Pneumovax:  never Zostavax:  Never; +chicken pox Influenza:  refuses Eye exam:  +glasses; yearly in March; no g/c. Dental exam:  Few teeth remaining; several years ago.  HTN: started Procardia XL 47m daily; BP is elevated still; running 132-136/84-86.  No side effects to medication.    Lower back pain and L leg pain:  Worse with starting Procardia.  Chronic issue for patient.  Review of Systems  Constitutional: Negative for fever, chills, diaphoresis, activity change, appetite change, fatigue and unexpected weight change.  HENT: Negative for congestion, dental problem, drooling, ear discharge, ear pain, facial swelling, hearing loss, mouth sores, nosebleeds, postnasal drip, rhinorrhea, sinus pressure, sneezing, sore throat, tinnitus, trouble swallowing and voice change.   Eyes: Negative for photophobia, pain, discharge, redness, itching and visual disturbance.  Respiratory: Negative for apnea, cough, choking, chest tightness, shortness of breath, wheezing and stridor.   Cardiovascular: Negative for chest pain, palpitations and leg swelling.  Gastrointestinal: Positive for diarrhea. Negative for nausea, vomiting, abdominal pain, constipation, blood in stool, abdominal distention, anal bleeding and rectal pain.  Endocrine: Negative for cold intolerance, heat intolerance, polydipsia, polyphagia and polyuria.  Genitourinary: Negative for dysuria, urgency, frequency, hematuria, flank pain, decreased urine volume, vaginal bleeding, vaginal discharge, enuresis, difficulty urinating, genital sores, vaginal  pain, menstrual problem, pelvic pain and dyspareunia.       Urinary leakage; +poise pad daily.  Musculoskeletal: Positive for myalgias, back pain, joint swelling, arthralgias, neck pain and neck stiffness. Negative for gait problem.  Skin: Negative for color change, pallor, rash and wound.  Allergic/Immunologic: Negative for environmental allergies, food allergies and immunocompromised state.  Neurological: Negative for dizziness, tremors, seizures, syncope, facial asymmetry, speech difficulty, weakness, light-headedness, numbness and headaches.  Hematological: Negative for adenopathy. Does not bruise/bleed easily.  Psychiatric/Behavioral: Positive for sleep disturbance and dysphoric mood. Negative for suicidal ideas, hallucinations, behavioral problems, confusion, self-injury, decreased concentration and agitation. The patient is not nervous/anxious and is not hyperactive.     Past Medical History  Diagnosis Date  . Allergy   . Asthma   . Hypertension   . Hyperlipidemia   . Fibromyalgia 2002    treated for Lyme's disease in 2001.  .Marland KitchenRosacea   . Restless leg syndrome     Cyclobenzaprine PRN.  . Nephrolithiasis   . Urinary, incontinence, stress female   . IBS (irritable bowel syndrome)     diarrhea predominant   Past Surgical History  Procedure Laterality Date  . Neck surgery  1993    2 disc surgery  . Carpal tunnel release  2006    right hand  . Abdominal hysterectomy  03/06/1975    ovaries intact.  Vaginal/uterine mass benign with hemorrhage.  . Cholecystectomy     Allergies  Allergen Reactions  . Codeine Itching  . Penicillins Rash  . Phenergan [Promethazine Hcl] Rash   History   Social History  . Marital Status: Married    Spouse Name: N/A  . Number of Children: N/A  . Years of Education: N/A   Occupational History  . Not on file.   Social  History Main Topics  . Smoking status: Never Smoker   . Smokeless tobacco: Never Used  . Alcohol Use: Yes     Comment:  rarely  . Drug Use: No  . Sexual Activity: Not on file   Other Topics Concern  . Not on file   Social History Narrative   Marital status: married x  16 years; second marriage; husband is bipolar and alcoholic.         Children: 1 son (80); 2 granddaughters but has no relationship with them.      Lives: with husband      Employment:  Works from home full time. Works for [employer]; enters Advertising account planner; desk job.      Tobacco: none      Alcohol: none      Exercise: none; no exercise since 2015.      Seatbelt: 100%      Guns: none      Family History  Problem Relation Age of Onset  . Diverticulitis Sister   . Hyperlipidemia Sister   . Hypertension Sister   . Fibromyalgia Sister   . Hypertension Sister   . Hyperlipidemia Sister   . Hypertension Mother   . Hyperlipidemia Mother   . Alcohol abuse Mother   . Cancer Mother     tongue/oral, throat, bladder cancer with mets  . Alcohol abuse Father   . Heart disease Father 76    AMI s/p CABG  . Cirrhosis Father         Objective:    BP 160/92 mmHg  Pulse 99  Temp(Src) 98.3 F (36.8 C) (Oral)  Resp 16  Ht _0  (1.676 m)  Wt 218 lb (98.884 kg)  BMI 35.20 kg/m2  SpO2 98% Physical Exam  Constitutional: She is oriented to person, place, and time. She appears well-developed and well-nourished. No distress.  Obese   HENT:  Head: Normocephalic and atraumatic.  Right Ear: External ear normal.  Left Ear: External ear normal.  Nose: Nose normal.  Mouth/Throat: Oropharynx is clear and moist.  Eyes: Conjunctivae and EOM are normal. Pupils are equal, round, and reactive to light.  Neck: Normal range of motion and full passive range of motion without pain. Neck supple. No JVD present. Carotid bruit is not present. No thyromegaly present.  Cardiovascular: Normal rate, regular rhythm, normal heart sounds and intact distal pulses.  Exam reveals no gallop and no friction rub.   No murmur heard. Pulmonary/Chest: Effort normal and  breath sounds normal. She has no wheezes. She has no rales. Right breast exhibits no inverted nipple, no mass, no nipple discharge, no skin change and no tenderness. Left breast exhibits no inverted nipple, no mass, no nipple discharge, no skin change and no tenderness. Breasts are symmetrical.  Abdominal: Soft. Bowel sounds are normal. She exhibits no distension and no mass. There is no tenderness. There is no rebound and no guarding.  Genitourinary: Vagina normal. Rectal exam shows external hemorrhoid. There is no rash, tenderness, lesion or injury on the right labia. There is no rash, tenderness, lesion or injury on the left labia. Right adnexum displays no mass, no tenderness and no fullness. Left adnexum displays no mass, no tenderness and no fullness.  Musculoskeletal:       Right shoulder: Normal.       Left shoulder: Normal.       Cervical back: Normal.  Lymphadenopathy:    She has no cervical adenopathy.  Neurological: She is alert and oriented to person,  place, and time. She has normal reflexes. No cranial nerve deficit. She exhibits normal muscle tone. Coordination normal.  Skin: Skin is warm and dry. No rash noted. She is not diaphoretic. No erythema. No pallor.  Psychiatric: She has a normal mood and affect. Her behavior is normal. Judgment and thought content normal.  Nursing note and vitals reviewed.  Results for orders placed or performed in visit on 03/22/14  CBC with Differential  Result Value Ref Range   WBC 6.5 4.0 - 10.5 K/uL   RBC 4.40 3.87 - 5.11 MIL/uL   Hemoglobin 14.1 12.0 - 15.0 g/dL   HCT 40.7 36.0 - 46.0 %   MCV 92.5 78.0 - 100.0 fL   MCH 32.0 26.0 - 34.0 pg   MCHC 34.6 30.0 - 36.0 g/dL   RDW 13.1 11.5 - 15.5 %   Platelets 260 150 - 400 K/uL   MPV 10.7 8.6 - 12.4 fL   Neutrophils Relative % 49 43 - 77 %   Neutro Abs 3.2 1.7 - 7.7 K/uL   Lymphocytes Relative 33 12 - 46 %   Lymphs Abs 2.1 0.7 - 4.0 K/uL   Monocytes Relative 14 (H) 3 - 12 %   Monocytes  Absolute 0.9 0.1 - 1.0 K/uL   Eosinophils Relative 3 0 - 5 %   Eosinophils Absolute 0.2 0.0 - 0.7 K/uL   Basophils Relative 1 0 - 1 %   Basophils Absolute 0.1 0.0 - 0.1 K/uL   Smear Review Criteria for review not met   Comprehensive metabolic panel  Result Value Ref Range   Sodium 139 135 - 145 mEq/L   Potassium 4.7 3.5 - 5.3 mEq/L   Chloride 102 96 - 112 mEq/L   CO2 27 19 - 32 mEq/L   Glucose, Bld 90 70 - 99 mg/dL   BUN 12 6 - 23 mg/dL   Creat 0.96 0.50 - 1.10 mg/dL   Total Bilirubin 0.4 0.2 - 1.2 mg/dL   Alkaline Phosphatase 58 39 - 117 U/L   AST 25 0 - 37 U/L   ALT 28 0 - 35 U/L   Total Protein 7.1 6.0 - 8.3 g/dL   Albumin 3.8 3.5 - 5.2 g/dL   Calcium 9.3 8.4 - 10.5 mg/dL  TSH  Result Value Ref Range   TSH 1.120 0.350 - 4.500 uIU/mL  POCT urinalysis dipstick  Result Value Ref Range   Color, UA yellow    Clarity, UA hazy    Glucose, UA neg    Bilirubin, UA neg    Ketones, UA neg    Spec Grav, UA 1.015    Blood, UA trace-lysed    pH, UA 5.5    Protein, UA neg    Urobilinogen, UA 0.2    Nitrite, UA neg    Leukocytes, UA small (1+)        Assessment & Plan:   1. Routine physical examination   2. Essential hypertension, benign   3. Hyperlipidemia   4. Fibromyalgia   5. Other seasonal allergic rhinitis   6. Asthma, unspecified asthma severity, uncomplicated   7. Breast cancer screening   8. Colon cancer screening   9. Screening for diabetes mellitus      1. Complete Physical Examination: anticipatory guidance --- weight loss, exercise, ASA 77m daily, 3 servings of dairy daily.  Pap smear not indicated due to hysterectomy status.  Refer for mammogram.  Pt refuses colonoscopy but agreeable to complete stool cards at home.  Pt refused  influenza vaccine; agreeable to Zostavax; rx provided.   2.  HTN: uncontrolled; obtain u/a, EKG, labs.  Increase Procardia XL to 36m daily; pt requesting brand name only. 3.  Hyperlidemia: uncontrolled; refuses statins.  Obtain  labs. 4.  Fibromyalgia: stable.  5.  Allergic Rhinitis: stable. 6.  Asthma: controlled. 7.  Breast cancer screening: refer for mammogram. 8.  Colon cancer screening: refuses colonoscopy; provided with hemosure kit to complete at home. 9. Screening diabetes: obtain HgbA1c.   Meds ordered this encounter  Medications  . Ibuprofen (ADVIL PO)    Sig: Take by mouth.  . Multiple Vitamin (MULTI-VITAMIN DAILY PO)    Sig: Take by mouth.  .Marland KitchenNIFEdipine (PROCARDIA XL) 60 MG 24 hr tablet    Sig: Take 1 tablet (60 mg total) by mouth daily.    Dispense:  30 tablet    Refill:  5  . zoster vaccine live, PF, (ZOSTAVAX) 136859UNT/0.65ML injection    Sig: Inject 19,400 Units into the skin once.    Dispense:  0.65 mL    Refill:  0    Return in about 3 months (around 08/26/2014) for recheck high blood pressure.     Kimberlie Csaszar MElayne Guerin M.D. [facility]  [phone] [phone]

## 2014-05-27 LAB — HEMOGLOBIN A1C
Hgb A1c MFr Bld: 5.9 % — ABNORMAL HIGH (ref <5.7)
Mean Plasma Glucose: 123 mg/dL — ABNORMAL HIGH (ref <117)

## 2014-05-28 ENCOUNTER — Telehealth: Payer: Self-pay

## 2014-05-28 MED ORDER — NIFEDIPINE ER OSMOTIC RELEASE 60 MG PO TB24: 60.0000 mg | ORAL_TABLET | Freq: Every day | ORAL | Status: DC

## 2014-05-28 NOTE — Telephone Encounter (Signed)
Pt would like the generic version of NIFEdipine (PROCARDIA XL) 60 MG 24 hr tablet [[id number]][[id number]] this. She doesn't do well with this. Please advise at [phone]

## 2014-05-28 NOTE — Telephone Encounter (Signed)
Pt would like generic of Procardia. She says the brand name "doesn't do her body well". Please advise.

## 2014-05-28 NOTE — Telephone Encounter (Signed)
Generic Procardia sent to pharmacy.

## 2014-05-29 NOTE — Telephone Encounter (Signed)
Spoke with pt. She wants Brand name not generic. Called pharm and told them to dispense brand name.

## 2014-06-10 ENCOUNTER — Ambulatory Visit (INDEPENDENT_AMBULATORY_CARE_PROVIDER_SITE_OTHER): Payer: 59 | Admitting: Family Medicine

## 2014-06-10 VITALS — BP 170/104 | HR 88 | Temp 98.4°F | Resp 16 | Ht 66.0 in | Wt 221.4 lb

## 2014-06-10 DIAGNOSIS — I10 Essential (primary) hypertension: Secondary | ICD-10-CM

## 2014-06-10 DIAGNOSIS — F430 Acute stress reaction: Secondary | ICD-10-CM | POA: Diagnosis not present

## 2014-06-10 MED ORDER — NIFEDIPINE ER OSMOTIC RELEASE 60 MG PO TB24: 60.0000 mg | ORAL_TABLET | Freq: Every day | ORAL | Status: DC

## 2014-06-10 NOTE — Patient Instructions (Addendum)
1 Call Dr. Katrinka BlazingSmith tomorrow with update and blood pressure readings.   Hypertension Hypertension, commonly called high blood pressure, is when the force of blood pumping through your arteries is too strong. Your arteries are the blood vessels that carry blood from your heart throughout your body. A blood pressure reading consists of a higher number over a lower number, such as 110/72. The higher number (systolic) is the pressure inside your arteries when your heart pumps. The lower number (diastolic) is the pressure inside your arteries when your heart relaxes. Ideally you want your blood pressure below 120/80. Hypertension forces your heart to work harder to pump blood. Your arteries may become narrow or stiff. Having hypertension puts you at risk for heart disease, stroke, and other problems.  RISK FACTORS Some risk factors for high blood pressure are controllable. Others are not.  Risk factors you cannot control include:   Race. You may be at higher risk if you are African American.  Age. Risk increases with age.  Gender. Men are at higher risk than women before age 63 years. After age 63, women are at higher risk than men. Risk factors you can control include:  Not getting enough exercise or physical activity.  Being overweight.  Getting too much fat, sugar, calories, or salt in your diet.  Drinking too much alcohol. SIGNS AND SYMPTOMS Hypertension does not usually cause signs or symptoms. Extremely high blood pressure (hypertensive crisis) may cause headache, anxiety, shortness of breath, and nosebleed. DIAGNOSIS  To check if you have hypertension, your health care provider will measure your blood pressure while you are seated, with your arm held at the level of your heart. It should be measured at least twice using the same arm. Certain conditions can cause a difference in blood pressure between your right and left arms. A blood pressure reading that is higher than normal on one  occasion does not mean that you need treatment. If one blood pressure reading is high, ask your health care provider about having it checked again. TREATMENT  Treating high blood pressure includes making lifestyle changes and possibly taking medicine. Living a healthy lifestyle can help lower high blood pressure. You may need to change some of your habits. Lifestyle changes may include:  Following the DASH diet. This diet is high in fruits, vegetables, and whole grains. It is low in salt, red meat, and added sugars.  Getting at least 2 hours of brisk physical activity every week.  Losing weight if necessary.  Not smoking.  Limiting alcoholic beverages.  Learning ways to reduce stress. If lifestyle changes are not enough to get your blood pressure under control, your health care provider may prescribe medicine. You may need to take more than one. Work closely with your health care provider to understand the risks and benefits. HOME CARE INSTRUCTIONS  Have your blood pressure rechecked as directed by your health care provider.   Take medicines only as directed by your health care provider. Follow the directions carefully. Blood pressure medicines must be taken as prescribed. The medicine does not work as well when you skip doses. Skipping doses also puts you at risk for problems.   Do not smoke.   Monitor your blood pressure at home as directed by your health care provider. SEEK MEDICAL CARE IF:   You think you are having a reaction to medicines taken.  You have recurrent headaches or feel dizzy.  You have swelling in your ankles.  You have trouble with your vision.  SEEK IMMEDIATE MEDICAL CARE IF:  You develop a severe headache or confusion.  You have unusual weakness, numbness, or feel faint.  You have severe chest or abdominal pain.  You vomit repeatedly.  You have trouble breathing. MAKE SURE YOU:   Understand these instructions.  Will watch your  condition.  Will get help right away if you are not doing well or get worse. Document Released: 02/19/2005 Document Revised: 07/06/2013 Document Reviewed: 12/12/2012 [facility] Patient Information 2015 [place], [place]. This information is not intended to replace advice given to you by your health care provider. Make sure you discuss any questions you have with your health care provider.

## 2014-06-10 NOTE — Progress Notes (Signed)
Subjective:    Patient ID: Mindy Hood, female    DOB: 10/26/1950, 64 y.o.   MRN: [id number] This chart was scribed for Nilda SimmerKristi Smith, MD by Littie Deedsichard Sun, Medical Scribe. This patient was seen in Room 5 and the patient's care was started at 6:54 PM.    06/10/2014  Hypertension   HPI  HPI Comments: Mindy Hood is a 64 y.o. female with a hx of hypertension who presents to the [facility] for a blood pressure re-check. Patient started her higher dose of Procardia XL 60mg  daily last week. She notes that the medication kept her up at night for the first 2 nights. She did take muscle relaxer which did help her sleep some. Her blood pressure was doing well 6 days ago (131/74), but she states it has been increasing throughout the week (144/83 the next day, 160+/94-98 the following day, 181/104 today). Patient notes that she woke up today not feeling well. She also reports having 1-2 headaches over the past week, palpitations, and vision becoming wavy (not blurry). She notes that she can sometimes feel her pulse in her wrist or behind her ears. Patient denies fever, sore, throat, ear pain, chest pain, abdominal pain, and increase in leg swelling. She also denies any new supplements or medications. 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 Patient did see her eye doctor recently, who told her that she had damage in her right eye due to high blood pressure. She had been tried on Valsartan, Benicar, and lisinopril in the past, but she had side effects due to the medications. Patient only wants brand name medications.     Review of Systems  Constitutional: Negative for fever, chills, diaphoresis and fatigue.  HENT: Negative for ear pain and sore throat.   Eyes: Positive for visual disturbance.  Respiratory: Negative for cough and shortness of breath.   Cardiovascular: Positive for  palpitations. Negative for chest pain and leg swelling.  Gastrointestinal: Negative for nausea, vomiting, abdominal pain, diarrhea and constipation.  Endocrine: Negative for cold intolerance, heat intolerance, polydipsia, polyphagia and polyuria.  Neurological: Positive for headaches. Negative for dizziness, tremors, seizures, syncope, facial asymmetry, speech difficulty, weakness, light-headedness and numbness.  Psychiatric/Behavioral: Positive for sleep disturbance. The patient is nervous/anxious.     Past Medical History  Diagnosis Date  . Allergy   . Asthma   . Hypertension   . Hyperlipidemia   . Fibromyalgia 2002    treated for Lyme's disease in 2001.  Marland Kitchen. Rosacea   . Restless leg syndrome     Cyclobenzaprine PRN.  . Nephrolithiasis   . Urinary, incontinence, stress female   . IBS (irritable bowel syndrome)     diarrhea predominant   Past Surgical History  Procedure Laterality Date  . Neck surgery  1993    2 disc surgery  . Carpal tunnel release  2006    right hand  . Abdominal hysterectomy  03/06/1975    ovaries intact.  Vaginal/uterine mass benign with hemorrhage.  . Cholecystectomy     Allergies  Allergen Reactions  . Codeine Itching  . Penicillins Rash  . Phenergan [Promethazine Hcl] Rash   Current Outpatient Prescriptions  Medication Sig Dispense Refill  . albuterol (PROVENTIL HFA;VENTOLIN HFA) 108 (90 BASE) MCG/ACT inhaler Inhale 2 puffs into the lungs every 6 (six) hours as needed. 18 g 11  . beclomethasone (QVAR) 40 MCG/ACT inhaler  Inhale 2 puffs into the lungs 2 (two) times daily. 1 Inhaler 12  . cetirizine (ZYRTEC) 10 MG tablet Take 1 tablet (10 mg total) by mouth daily. 30 tablet 11  . cyclobenzaprine (FLEXERIL) 5 MG tablet Take 1 tablet (5 mg total) by mouth at bedtime. 30 tablet 3  . doxycycline (VIBRAMYCIN) 100 MG capsule Take 1 capsule (100 mg total) by mouth 2 (two) times daily. 60 capsule 3  . EPINEPHrine 0.3 mg/0.3 mL IJ SOAJ injection Inject 0.3 mLs  (0.3 mg total) into the muscle once. 1 Device 1  . Ibuprofen (ADVIL PO) Take by mouth.    . Multiple Vitamin (MULTI-VITAMIN DAILY PO) Take by mouth.    Marland Kitchen NIFEdipine (PROCARDIA XL/ADALAT-CC) 60 MG 24 hr tablet Take 1-2 tablets (60-120 mg total) by mouth daily. 30 tablet 5  . zoster vaccine live, PF, (ZOSTAVAX) 40981 UNT/0.65ML injection Inject 19,400 Units into the skin once. (Patient not taking: Reported on 06/10/2014) 0.65 mL 0   No current facility-administered medications for this visit.       Objective:    BP 170/104 mmHg  Pulse 88  Temp(Src) 98.4 F (36.9 C) (Oral)  Resp 16  Ht  (1.676 m)  Wt 221 lb 6 oz (100.415 kg)  BMI 35.75 kg/m2  SpO2 96% Physical Exam  Constitutional: She is oriented to person, place, and time. She appears well-developed and well-nourished. No distress.  HENT:  Head: Normocephalic and atraumatic.  Right Ear: External ear normal.  Left Ear: External ear normal.  Nose: Nose normal.  Mouth/Throat: Oropharynx is clear and moist. No oropharyngeal exudate.  Eyes: Conjunctivae and EOM are normal. Pupils are equal, round, and reactive to light.  Neck: Normal range of motion. Neck supple. Carotid bruit is not present. No thyromegaly present.  Cardiovascular: Normal rate, regular rhythm, normal heart sounds and intact distal pulses.  Exam reveals no gallop and no friction rub.   No murmur heard. Repeat blood pressure: 178/100.  Pulmonary/Chest: Effort normal and breath sounds normal. No respiratory distress. She has no wheezes. She has no rales.  CTAB.  Abdominal: Soft. Bowel sounds are normal. She exhibits no distension and no mass. There is no tenderness. There is no rebound and no guarding.  Musculoskeletal: She exhibits no edema.  No leg swelling.  Lymphadenopathy:    She has no cervical adenopathy.  Neurological: She is alert and oriented to person, place, and time. No cranial nerve deficit.  Skin: Skin is warm and dry. No rash noted. She is not  diaphoretic. No erythema. No pallor.  Psychiatric: She has a normal mood and affect. Her behavior is normal.  Vitals reviewed.  Results for orders placed or performed in visit on 05/26/14  Hemoglobin A1c  Result Value Ref Range   Hgb A1c MFr Bld 5.9 (H) <5.7 %   Mean Plasma Glucose 123 (H) <117 mg/dL  Lipid panel  Result Value Ref Range   Cholesterol 228 (H) 0 - 200 mg/dL   Triglycerides 191 (H) <150 mg/dL   HDL 43 (L) >=47 mg/dL   Total CHOL/HDL Ratio 5.3 Ratio   VLDL 39 0 - 40 mg/dL   LDL Cholesterol 829 (H) 0 - 99 mg/dL  POCT urinalysis dipstick  Result Value Ref Range   Color, UA yellow    Clarity, UA clear    Glucose, UA neg    Bilirubin, UA neg    Ketones, UA neg    Spec Grav, UA 1.015    Blood, UA trace  pH, UA 5.5    Protein, UA trace    Urobilinogen, UA 0.2    Nitrite, UA neg    Leukocytes, UA small (1+)        Assessment & Plan:   1. Essential hypertension, benign   2. Acute stress reaction     -Worsening BP due to work stressors. -Increase Procardia XL  to two tablets daily. -If needs additional medication, will add HCTZ. -Obtain BMET. -Call in 24 hours with update on BP readings and status.   Meds ordered this encounter  Medications  . NIFEdipine (PROCARDIA XL/ADALAT-CC) 60 MG 24 hr tablet    Sig: Take 1-2 tablets (60-120 mg total) by mouth daily.    Dispense:  30 tablet    Refill:  5    Return in about 2 weeks (around 06/24/2014).    I personally performed the services described in this documentation, which was scribed in my presence. The recorded information has been reviewed and considered.  Kristi Paulita Fujita, M.D. [facility] [address] [place], [place]  [address] [phone] phone [phone] fax

## 2014-06-11 ENCOUNTER — Telehealth: Payer: Self-pay

## 2014-06-11 LAB — BASIC METABOLIC PANEL
BUN: 14 mg/dL (ref 6–23)
CALCIUM: 9.6 mg/dL (ref 8.4–10.5)
CO2: 30 meq/L (ref 19–32)
CREATININE: 0.76 mg/dL (ref 0.50–1.10)
Chloride: 102 mEq/L (ref 96–112)
GLUCOSE: 92 mg/dL (ref 70–99)
Potassium: 4.8 mEq/L (ref 3.5–5.3)
SODIUM: 140 meq/L (ref 135–145)

## 2014-06-11 NOTE — Telephone Encounter (Signed)
Patient is leaving a message for Dr. Katrinka BlazingSmith. Her blood pressure dropped from 145/92 and this morning it was 111/74 at 6:30am, at lunch 12:30 151/94, and at 4:15 it was 144/96. Patient phone [phone]

## 2014-06-11 NOTE — Telephone Encounter (Signed)
Call --- 1. Thank patient for update. Continue current dose of medication.  Please have patient call on Monday with BP readings again.

## 2014-06-11 NOTE — Telephone Encounter (Signed)
lmom of message. 

## 2014-06-18 ENCOUNTER — Other Ambulatory Visit: Payer: Self-pay

## 2014-06-18 NOTE — Telephone Encounter (Signed)
Pt needs a refill on her NIFEdipine (PROCARDIA XL/ADALAT-CC) 60 MG 24 hr tablet [[id number]][[id number]] -the "brand". Same pharmacy. Please advise at [phone]

## 2014-06-18 NOTE — Telephone Encounter (Signed)
BP numbers:  Wed 113/78, Noon 127/96, 8pm 129/81  Thurs, 111/70, 142/89, 127/73  Fri 127/81, 117/90  Since she started the 2 pills of the Procardia she has also added 2 teaspoons of vinegar with juice. She is not puffy around her ankle that much as she was when you saw her. Please advise.

## 2014-06-19 ENCOUNTER — Other Ambulatory Visit: Payer: Self-pay | Admitting: Family Medicine

## 2014-06-19 MED ORDER — NIFEDIPINE ER OSMOTIC RELEASE 60 MG PO TB24: 60.0000 mg | ORAL_TABLET | Freq: Every day | ORAL | Status: DC

## 2014-06-21 ENCOUNTER — Telehealth: Payer: Self-pay

## 2014-06-21 MED ORDER — NIFEDIPINE ER OSMOTIC RELEASE 60 MG PO TB24: ORAL_TABLET | ORAL | Status: DC

## 2014-06-21 NOTE — Telephone Encounter (Signed)
Pt states she needs a letter specifying that she cannot take generic Procardia so that her copay will go from $400 to $85   Best phone for pt is [phone]

## 2014-06-21 NOTE — Telephone Encounter (Signed)
Spoke with pharmacist, i resent the Rx stating the Rx is for brand name only. It should go through now.

## 2014-06-25 NOTE — H&P (Signed)
PATIENT NAME:  Mindy Hood, Mindy Hood MR#:  [id number] DATE OF BIRTH:  March 13, 1950  DATE OF ADMISSION:  09/17/2012  CHIEF COMPLAINT: Abdominal pain.   HISTORY OF PRESENT ILLNESS: This is a 64 year old female patient with recurrent episodes of right upper quadrant pain and epigastric pain associated with fatty food intolerance and work-up showing gallstones. She is here for elective laparoscopic cholecystectomy for control of her symptoms.   PAST MEDICAL HISTORY: Hypertension, hyperlipidemia fibromyalgia, irritable bowel syndrome, Lyme's  disease, stroke and restless leg syndrome.   PAST SURGICAL HISTORY: Hysterectomy, neck surgery and carpal tunnel surgery.   MEDICATIONS: Multiple, see chart.   ALLERGIES: LATEX, PENICILLIN, CODEINE AND LIPITOR.  FAMILY HISTORY: Noncontributory.  SOCIAL HISTORY: The patient does not smoke or drink.  REVIEW OF SYSTEMS:  Ten system review has been performed and negative with the exception of that mentioned in the HPI.  PHYSICAL EXAMINATION: GENERAL: Healthy-appearing female patient.  HEENT: No scleral icterus.  NECK: No palpable neck nodes.  CHEST: Clear to auscultation.  CARDIAC: Regular rate and rhythm.  ABDOMEN: Soft, nontender.  EXTREMITIES: Without edema.  NEUROLOGIC: Grossly intact.  INTEGUMENT: No jaundice.   LABORATORY AND DIAGNOSTICS: Demonstrate a H and H of 14.6 and 43 with a platelet count of 243. Liver function tests are within normal limits. An ultrasound shows stones.   ASSESSMENT AND PLAN: This is a patient with gallstones and a work-up showing probable symptomatic cholelithiasis. The rationale for this has been discussed. The option of observation has been reviewed and the risks of bleeding, infection, recurrence of disease, recurrence of symptoms and failure to resolve all of her symptoms, as well as the risks of bile duct injury, bowel injury and bile duct leak, any of which could require further surgery and/or ERCP, stent, and papillotomy.  This has been reviewed for her and will be reviewed again in the preop  holding area.  ____________________________ Adah Salvageichard E. Excell Seltzerooper, MD rec:sb D: 09/15/2012 10:21:58 ET T: 09/15/2012 10:39:48 ET JOB#: [id number]  cc: Adah Salvageichard E. Excell Seltzerooper, MD, <Dictator> Lattie HawICHARD E Bresha Hosack MD ELECTRONICALLY SIGNED 09/15/2012 14:37

## 2014-06-25 NOTE — Op Note (Signed)
PATIENT NAME:  Mindy Hood, Mindy Hood MR#:  [id number] DATE OF BIRTH:  1950/07/16  DATE OF PROCEDURE:  09/17/2012  PREOPERATIVE DIAGNOSIS: Symptomatic cholelithiasis.   POSTOPERATIVE DIAGNOSIS: Symptomatic cholelithiasis.   PROCEDURE: Laparoscopic cholecystectomy.   SURGEON: Dionne Miloichard Cooper.   ASSISTANT: Jannifer HickMatthew Rainbolt, P.A.-S.  ANESTHESIA: General, with endotracheal tube.   INDICATIONS: This is a patient with recurrent epigastric and right upper quadrant pain associated with fatty food intolerance and workup showing gallstones. Preoperatively we discussed the rationale for surgery, the options of observation, risks of bleeding, infection, recurrence of symptoms, failure to resolve all of her symptoms, open procedure, bile duct damage, bile duct leak, retained common bile duct stone, any of which could require further surgery, and/or ERCP, stent, and papillotomy. This was all reviewed for her and her husband in the preop holding area. They understood and agreed to proceed.   FINDINGS: Multiple gallstones and scar to the anterior surface of the gallbladder.   Also of note was a thickening of the fundus of the gallbladder like a Phrygian cap, which was  quite thickened, and this was the area of the most prominent scarring. Pathology is pending at this time.   DESCRIPTION OF PROCEDURE: The patient was induced to general anesthesia, given IV antibiotics. VTE prophylaxis was in place. She was prepped and draped in a sterile fashion. Marcaine was infiltrated in the skin and subcutaneous tissues around the periumbilical area. An incision was made. Veress needle was placed. Pneumoperitoneum was obtained. A 5 mm trocar port was placed. The abdominal cavity was explored, and under direct vision a 10 mm epigastric port and two lateral 5 mm ports were placed. The gallbladder was placed on tension. The peritoneum over the infundibulum was incised bluntly after taking down multiple, mature chronic-type adhesions with  sharp dissection without the use of energy. Taking down the peritoneum allowed for good visualization of the cystic duct as it entered the gallbladder at the infundibulum. Also noted was the cystic artery extending up onto the gallbladder. The cystic artery was doubly-clipped and divided. This allowed for good visualization of the cystic duct as it entered the infundibulum. It was doubly-clipped and divided. The gallbladder was taken from the gallbladder fossa with electrocautery, and in so doing it was noted the lateral arterial branch, which was triply-clipped and divided. Then the gallbladder is passed out through the epigastric port site with the aid of an EndoCatch bag. The area was checked for hemostasis and found to be adequate. There was no sign of bleeding or bile leak. Further irrigation and aspiration was performed and no open further bleeding was noted.   The camera was placed in the epigastric site to view back to the periumbilical site. There was no sign of adhesions or bowel injury. Therefore, pneumoperitoneum was released. All ports were removed. Fascial edges at the epigastric site were approximated with 0 Vicryl; 4-0 subcuticular Monocryl was used on all skin edges. Steri-Strips, Mastisol, and sterile dressings were placed.   The patient tolerated the procedure well. There were no complications. She was taken to the recovery room in stable condition. She will be discharged in the care of her family; follow-up in 10 days.    ____________________________ Adah Salvageichard E. Excell Seltzerooper, MD rec:dm D: 09/17/2012 08:14:22 ET T: 09/17/2012 09:25:10 ET JOB#: [id number]  cc: Adah Salvageichard E. Excell Seltzerooper, MD, <Dictator> Lattie HawICHARD E COOPER MD ELECTRONICALLY SIGNED 09/17/2012 10:46

## 2014-06-26 ENCOUNTER — Telehealth: Payer: Self-pay

## 2014-06-26 NOTE — Telephone Encounter (Signed)
Dr Smith-- Please advise. 

## 2014-06-26 NOTE — Telephone Encounter (Signed)
PATIENT WANTS DR. Katrinka BlazingSMITH TO KNOW THAT SHE HAS NOTICED HER LEGS AND ANKLES SWELLING TODAY MORE THAN USUAL. SHE IS HOLDING A LOT OF FLUID. SHE STATES DR. Katrinka BlazingSMITH DOUBLED HER BLOOD PRESSURE MEDICATION AND TOLD HER THAT SHE MAY HAVE TO PRESCRIBE HER A DIURETIC AS WELL. BEST PHONE [phone] (CELL) PHARMACY CHOICE IS [facility] ON [address].  MBC

## 2014-06-28 NOTE — Telephone Encounter (Signed)
Pt states she is at the beach and won't be back until Thursday. She will come in Thurs night to see you.

## 2014-06-28 NOTE — Telephone Encounter (Signed)
Please call patient --- I would like to see her this week; I have two new patient appointment availabilities this Wednesday; please schedule her there.

## 2014-06-29 NOTE — Telephone Encounter (Signed)
Noted  

## 2014-07-01 ENCOUNTER — Ambulatory Visit (INDEPENDENT_AMBULATORY_CARE_PROVIDER_SITE_OTHER): Payer: 59 | Admitting: Family Medicine

## 2014-07-01 VITALS — BP 140/78 | HR 102 | Temp 97.1°F | Resp 20 | Ht 67.0 in | Wt 225.1 lb

## 2014-07-01 DIAGNOSIS — I10 Essential (primary) hypertension: Secondary | ICD-10-CM | POA: Diagnosis not present

## 2014-07-01 DIAGNOSIS — M7989 Other specified soft tissue disorders: Secondary | ICD-10-CM | POA: Diagnosis not present

## 2014-07-01 MED ORDER — HYDROCHLOROTHIAZIDE 12.5 MG PO TABS
12.5000 mg | ORAL_TABLET | Freq: Every day | ORAL | Status: DC
Start: 2014-07-01 — End: 2014-10-06

## 2014-07-01 NOTE — Progress Notes (Signed)
Patient ID: Mindy Hood, female   DOB: 1950/05/20, 63 y.o.   MRN: [id number]   Subjective:  This chart was scribed for Nilda Simmer, MD by [facility], medical scribe at [facility].The patient was seen in exam room 01 and the patient's care was started at 7:43 PM.   Patient ID: Mindy Hood, female    DOB: February 16, 1951, 64 y.o.   MRN: [id number]  07/01/2014  Foot Swelling  HPI HPI Comments: Jenness Stemler is a 64 y.o. female with a history of hypertension who presents to [facility] complaining of bilateral leg swelling which began one week ago. Pt returned from the beach today, she left Monday morning at 6:00 AM. While at the beach her legs would be fine when she woke up and swell up around 11:00 AM. Her procardia was increased to two tablets daily on 06/10/2014. Side effects she has noticed is constipation, increased urinary frequency and leg swelling. Pt blood pressure in the morning is typically 111-123/73-24. In the afternoon her blood pressure is typically 140-156/80-93. She added 1200 mg fish oil to her diet and two teaspoons of vinegar a night. Legs are sun burnt due to trip to beach this week. Pt states she does experience some light headedness. No changes in her diet. She denies chest pain, heart palpation, shortness of breath, and shortness of breath when laying down.   Over the past three years [place] their estranged relationships.  Review of Systems  Constitutional: Negative for fever, chills, diaphoresis and fatigue.  HENT: Negative for ear pain, postnasal drip, rhinorrhea, sinus pressure, sore throat and trouble swallowing.   Respiratory: Negative for cough and shortness of breath.   Cardiovascular: Positive for leg swelling. Negative for chest pain and palpitations.  Gastrointestinal: Positive for constipation. Negative for nausea, vomiting, abdominal pain and diarrhea.  Genitourinary:  Positive for frequency.  Neurological: Positive for light-headedness.   Past Medical History  Diagnosis Date  . Allergy   . Asthma   . Hypertension   . Hyperlipidemia   . Fibromyalgia 2002    treated for Lyme's disease in 2001.  Marland Kitchen Rosacea   . Restless leg syndrome     Cyclobenzaprine PRN.  . Nephrolithiasis   . Urinary, incontinence, stress female   . IBS (irritable bowel syndrome)     diarrhea predominant   Past Surgical History  Procedure Laterality Date  . Neck surgery  1993    2 disc surgery  . Carpal tunnel release  2006    right hand  . Abdominal hysterectomy  03/06/1975    ovaries intact.  Vaginal/uterine mass benign with hemorrhage.  . Cholecystectomy     Allergies  Allergen Reactions  . Codeine Itching  . Penicillins Rash  . Phenergan [Promethazine Hcl] Rash       Objective:    BP 140/78 mmHg  Pulse 102  Temp(Src) 97.1 F (36.2 C) (Oral)  Resp 20  Ht  (1.702 m)  Wt 225 lb 2 oz (102.116 kg)  BMI 35.25 kg/m2  SpO2 98% Physical Exam  Constitutional: She is oriented to person, place, and time. She appears well-developed and well-nourished. No distress.  HENT:  Head: Normocephalic and atraumatic.  Right Ear: External ear normal.  Left Ear: External ear normal.  Nose: Nose normal.  Mouth/Throat: Oropharynx is clear and moist.  Eyes: Conjunctivae and EOM are normal. Pupils are equal, round, and reactive to  light.  Neck: Normal range of motion. Neck supple. Carotid bruit is not present. No thyromegaly present.  Cardiovascular: Normal rate, regular rhythm, normal heart sounds and intact distal pulses.  Exam reveals no gallop and no friction rub.   No murmur heard. Pulmonary/Chest: Effort normal and breath sounds normal. She has no wheezes. She has no rales.  Abdominal: Soft. Bowel sounds are normal. She exhibits no distension and no mass. There is no tenderness. There is no rebound and no guarding.  Musculoskeletal: She exhibits edema.  2+ pitting  edema up to the proximal calves bilaterally.   Lymphadenopathy:    She has no cervical adenopathy.  Neurological: She is alert and oriented to person, place, and time. No cranial nerve deficit.  Skin: Skin is warm and dry. No rash noted. She is not diaphoretic. No erythema. No pallor.  Psychiatric: She has a normal mood and affect. Her behavior is normal.   Results for orders placed or performed in visit on 06/10/14  Basic metabolic panel  Result Value Ref Range   Sodium 140 135 - 145 mEq/L   Potassium 4.8 3.5 - 5.3 mEq/L   Chloride 102 96 - 112 mEq/L   CO2 30 19 - 32 mEq/L   Glucose, Bld 92 70 - 99 mg/dL   BUN 14 6 - 23 mg/dL   Creat 1.610.76 0.960.50 - 0.451.10 mg/dL   Calcium 9.6 8.4 - 40.910.5 mg/dL      Assessment & Plan:   1. Leg swelling   2. Essential hypertension, benign    1. Leg swelling: New.  Likely secondary to increased Procardia dose.  Decrease Procardia to one tablet daily; start HCTZ 12.5mg  one tablet daily.  Pt declined/refused repeat labs and u/a.  If no improvement with medication changes, will warrant labs; no evidence of new onset CHF by symptomatology. 2. HTN: improved control yet now suffering with leg swelling on higher dose of Procardia; decrease Procardia to one tablet daily; add HCTZ 12.5mg  one tablet every morning.    Meds ordered this encounter  Medications  . Fish Oil-Cholecalciferol (FISH OIL + D3) 1200-1000 MG-UNIT CAPS    Sig: Take 1 capsule by mouth daily.  . hydrochlorothiazide (HYDRODIURIL) 12.5 MG tablet    Sig: Take 1-2 tablets (12.5-25 mg total) by mouth daily.    Dispense:  60 tablet    Refill:  3   No Follow-up on file.  I personally performed the services described in this documentation, which was scribed in my presence. The recorded information has been reviewed and considered.  Ameliah Baskins Paulita FujitaMartin Chalee Hirota, M.D. [facility]   [address] [place], [place]  [id number] [phone] phone [phone] fax

## 2014-07-01 NOTE — Patient Instructions (Signed)
1.  Decrease Procardia XL 60mg  to one tablet daily. 2.  Start HCTZ 12.5mg  one tablet every morning. 3. Continue this regimen for two weeks.   4.  After two weeks, if you want to increase HCTZ 12.5mg  two tablets daily and stop Procardia.

## 2014-07-14 ENCOUNTER — Encounter: Payer: Self-pay | Admitting: Family Medicine

## 2014-07-14 ENCOUNTER — Ambulatory Visit (INDEPENDENT_AMBULATORY_CARE_PROVIDER_SITE_OTHER): Payer: 59 | Admitting: Family Medicine

## 2014-07-14 VITALS — BP 138/84 | HR 95 | Temp 98.4°F | Resp 16 | Ht 66.0 in | Wt 216.0 lb

## 2014-07-14 DIAGNOSIS — M7989 Other specified soft tissue disorders: Secondary | ICD-10-CM

## 2014-07-14 DIAGNOSIS — I10 Essential (primary) hypertension: Secondary | ICD-10-CM

## 2014-07-14 DIAGNOSIS — R413 Other amnesia: Secondary | ICD-10-CM | POA: Diagnosis not present

## 2014-07-14 LAB — COMPREHENSIVE METABOLIC PANEL
ALT: 25 U/L (ref 0–35)
AST: 22 U/L (ref 0–37)
Albumin: 4.1 g/dL (ref 3.5–5.2)
Alkaline Phosphatase: 69 U/L (ref 39–117)
BUN: 13 mg/dL (ref 6–23)
CALCIUM: 9.7 mg/dL (ref 8.4–10.5)
CHLORIDE: 98 meq/L (ref 96–112)
CO2: 30 mEq/L (ref 19–32)
Creat: 0.81 mg/dL (ref 0.50–1.10)
Glucose, Bld: 94 mg/dL (ref 70–99)
POTASSIUM: 4.2 meq/L (ref 3.5–5.3)
SODIUM: 141 meq/L (ref 135–145)
TOTAL PROTEIN: 7.7 g/dL (ref 6.0–8.3)
Total Bilirubin: 0.5 mg/dL (ref 0.2–1.2)

## 2014-07-14 LAB — POCT URINALYSIS DIPSTICK
Bilirubin, UA: NEGATIVE
Glucose, UA: NEGATIVE
Ketones, UA: NEGATIVE
NITRITE UA: NEGATIVE
PROTEIN UA: NEGATIVE
Spec Grav, UA: 1.015
UROBILINOGEN UA: 0.2
pH, UA: 7

## 2014-07-14 LAB — CBC WITH DIFFERENTIAL/PLATELET
BASOS ABS: 0.1 10*3/uL (ref 0.0–0.1)
Basophils Relative: 1 % (ref 0–1)
EOS PCT: 2 % (ref 0–5)
Eosinophils Absolute: 0.1 10*3/uL (ref 0.0–0.7)
HCT: 41.1 % (ref 36.0–46.0)
Hemoglobin: 14.6 g/dL (ref 12.0–15.0)
LYMPHS PCT: 39 % (ref 12–46)
Lymphs Abs: 2.5 10*3/uL (ref 0.7–4.0)
MCH: 32.7 pg (ref 26.0–34.0)
MCHC: 35.5 g/dL (ref 30.0–36.0)
MCV: 91.9 fL (ref 78.0–100.0)
MPV: 11 fL (ref 8.6–12.4)
Monocytes Absolute: 0.8 10*3/uL (ref 0.1–1.0)
Monocytes Relative: 13 % — ABNORMAL HIGH (ref 3–12)
NEUTROS ABS: 2.9 10*3/uL (ref 1.7–7.7)
NEUTROS PCT: 45 % (ref 43–77)
PLATELETS: 279 10*3/uL (ref 150–400)
RBC: 4.47 MIL/uL (ref 3.87–5.11)
RDW: 13.2 % (ref 11.5–15.5)
WBC: 6.5 10*3/uL (ref 4.0–10.5)

## 2014-07-14 NOTE — Patient Instructions (Signed)
1. Change to Claritin 10mg  one tablet daily. 2. Stop Zyrtec. 3.  Call in 3-7 days with update on memory loss.

## 2014-07-14 NOTE — Progress Notes (Signed)
Subjective:    Patient ID: Mindy Hood, female    DOB: 05/01/1950, 64 y.o.   MRN: [id number]  HPI  Mindy Hood is a pleasant 64 year old female who presents today for follow-up of hypertension and leg swelling.  Her blood pressure has been doing well since decreasing the Procardia dose to 30 mg daily and adding HCTZ 12.5 mg daily. She has noticed her leg swelling has decreased, but her legs are still not back to her normal size. Her blood pressure has been well controlled at home, running around 120/80 in the morning, increasing to 140/80 during the middle of the day, and increasing to 150/80 after the work day. Feels that her increased blood pressure is the result of stress from work.   Over the past week, she has noticed memory loss. She will start to do something at work on her computer, and then forget what she is doing--"my mind goes blank." Has trouble remembering what happened 3-4 hours prior. These events occur a couple times per day, and only last momentarily. She has not experienced this before, but noticed it mostly on Saturday and Sunday (07/10/14-07/11/14). Her husband also noticed her forgetting things. She said on Saturday she walked out of the kitchen and left the stove burner on, and that is not something she normally would do. She is concerned about this being a side effect from HCTZ or Zyrtec. She increased her Zyrtec to 10 mg daily in December. She takes it in the morning and feels tired throughout the day around 1 PM. Takes the Zyrtec for allergies to prevent an asthma attack. Allergies have been well-controlled this spring and she has not had an asthma attack since December. Has not needed to use the Epi pen that she was prescribed in December.  Her work continues to be stressful. 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 She works for [employer]. says that her every movement at work is tracked and based on her productivity, so she is unable to get up and walk around as  much as she would like during the day. Trying to change job positions in July. Works at home with her husband who is retired. He has bipolar disorder and will occasionally have flare-ups of anger episodes that last 3-4 days. These episodes cause her to be stressed. Most recent episode was last week.    Review of Systems  Constitutional: Negative for fever, chills, diaphoresis and fatigue.  HENT: Negative for congestion, ear pain, hearing loss, postnasal drip, rhinorrhea, sinus pressure, sneezing and sore throat.   Eyes: Negative for pain, itching and visual disturbance.  Respiratory: Negative for cough, chest tightness and shortness of breath.   Cardiovascular: Positive for leg swelling (Improved since decreasing Procardia to 30 mg). Negative for chest pain and palpitations.  Gastrointestinal: Negative for nausea, vomiting, abdominal pain, diarrhea and constipation.  Endocrine: Negative for polyuria.  Genitourinary: Negative for dysuria, urgency, frequency, hematuria and difficulty urinating.  Musculoskeletal: Negative for myalgias and arthralgias.  Skin: Negative for rash.  Neurological: Negative for dizziness, weakness, light-headedness and headaches.       Memory loss x 1 week  Psychiatric/Behavioral: Positive for decreased concentration (Memory loss x 1 week).       Objective:   Physical Exam  Constitutional: She is oriented to person, place, and time. She appears well-developed and well-nourished. No distress.  BP 138/84 mmHg  Pulse 95  Temp(Src) 98.4 F (36.9 C) (Oral)  Resp 16  Ht   (1.676 m)  Wt 216 lb (97.977 kg)  BMI 34.88 kg/m2  SpO2 96%  HENT:  Head: Normocephalic and atraumatic.  Right Ear: Hearing, tympanic membrane, external ear and ear canal normal.  Left Ear: Hearing, tympanic membrane, external ear and ear canal normal.  Nose: Nose normal.  Mouth/Throat: Uvula is midline, oropharynx is clear and moist and mucous membranes are normal. No oropharyngeal  exudate, posterior oropharyngeal edema or posterior oropharyngeal erythema.  Eyes: Conjunctivae and EOM are normal. Pupils are equal, round, and reactive to light.  Neck: Normal range of motion. Neck supple. No thyromegaly present.  Cardiovascular: Normal rate, regular rhythm, S1 normal, S2 normal and normal heart sounds.  Exam reveals no gallop and no friction rub.   No murmur heard. Pulses:      Dorsalis pedis pulses are 2+ on the right side, and 2+ on the left side.  Pulmonary/Chest: Effort normal and breath sounds normal. She has no wheezes. She has no rhonchi. She has no rales.  Musculoskeletal: Normal range of motion.       Right lower leg: She exhibits swelling and edema (1+ pitting edema to proximal shin).       Left lower leg: She exhibits swelling and edema (1+ pitting edema to proximal shin).  Lymphadenopathy:       Head (right side): No submental, no submandibular, no tonsillar, no preauricular, no posterior auricular and no occipital adenopathy present.       Head (left side): No submental, no submandibular, no tonsillar, no preauricular, no posterior auricular and no occipital adenopathy present.    She has no cervical adenopathy.       Right: No supraclavicular adenopathy present.       Left: No supraclavicular adenopathy present.  Neurological: She is alert and oriented to person, place, and time. She has normal strength.  Skin: Skin is warm and dry.  Psychiatric: She has a normal mood and affect. Her speech is normal and behavior is normal. Cognition and memory are normal.  She is able to recall events when asked about them, but in the moment she has temporary impaired thought processes.      Assessment & Plan:  1. Leg swelling Improved since decreasing Procardia dose. Still has pitting edema. Blood work to evaluate liver and kidney function. Will continue with current HCTZ medication until she can determine whether memory changes seem to be coming from HCTZ or Zyrtec. If  she continues to have issues with HCTZ, will switch to spironolactone and address swelling at that time.  - CBC with Differential/Platelet - Comprehensive metabolic panel - POCT urinalysis dipstick  2. Memory loss HCTZ seems to be helping her blood pressure, but she is concerned that it may be causing memory loss. She wants to continue to the HCTZ for another week and see if her memory improves. She also is switching from Zyrtec to Claritin due to daytime drowsiness. Discussed with her the pros and cons of staying on the medication. She will call back in 3-5 days and update regarding her memory loss. If she still has memory issues, we will discontinue the HCTZ for 1 week and see if her memory improves. If her memory improves after being off the HCTZ for 1 week, may consider starting Spironolactone for blood pressure and edema control.  - CBC with Differential/Platelet - Comprehensive metabolic panel  3. Essential hypertension, benign Stable. Continue current medication regimen, for now. If memory issues are related to HCTZ, will discontinue and consider starting  spironolactone for blood pressure control.  - CBC with Differential/Platelet - Comprehensive metabolic panel - POCT urinalysis dipstick

## 2014-08-04 ENCOUNTER — Encounter: Payer: Self-pay | Admitting: Family Medicine

## 2014-08-04 ENCOUNTER — Ambulatory Visit (INDEPENDENT_AMBULATORY_CARE_PROVIDER_SITE_OTHER): Payer: 59 | Admitting: Family Medicine

## 2014-08-04 VITALS — BP 144/85 | HR 85 | Temp 98.5°F | Resp 16 | Ht 66.0 in | Wt 215.4 lb

## 2014-08-04 DIAGNOSIS — I10 Essential (primary) hypertension: Secondary | ICD-10-CM

## 2014-08-04 DIAGNOSIS — J301 Allergic rhinitis due to pollen: Secondary | ICD-10-CM

## 2014-08-04 DIAGNOSIS — R413 Other amnesia: Secondary | ICD-10-CM

## 2014-08-04 NOTE — Progress Notes (Signed)
Subjective:    Patient ID: Mindy Hood, female    DOB: 08/03/1950, 64 y.o.   MRN: [id number]  08/04/2014  Follow-up   HPI This 64 y.o. female presents for one month follow-up for hypertension.  Stopped Procardia three days ago.  Upon awakening, blood pressure 118/70.  In afternoon, blood pressure increases.  Taking HCTZ 12.57m bid.  Tolerating well.  Blood pressure in afternoons 156/90 or less.  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  Must stay at work until husband can get Medicare. Checking tid.  Increased HCTZ to bid for four days ago.  Memory loss: Claritin caused stomach upset, muscle cramps.  Stopped Claritin.  Cut Zyrtec 1/2 tablet daily; memory is much better.  Thinks Zyrtec is cause of memory issues. 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  Eyes: Negative for visual disturbance.  Respiratory: Negative for cough and shortness of breath.   Cardiovascular: Negative for chest pain, palpitations and leg swelling.  Gastrointestinal: Negative for nausea, vomiting, abdominal pain, diarrhea and constipation.  Endocrine: Negative for cold intolerance, heat intolerance, polydipsia, polyphagia and polyuria.  Neurological: Negative for dizziness, tremors, seizures, syncope, facial asymmetry, speech difficulty, weakness, light-headedness, numbness and headaches.  Psychiatric/Behavioral: Negative for suicidal ideas, confusion, sleep disturbance, self-injury, dysphoric mood and agitation. The patient is nervous/anxious.     Past Medical History  Diagnosis Date  . Allergy   . Asthma   . Hypertension   . Hyperlipidemia   . Fibromyalgia 2002    treated for Lyme's disease in 2001.  .Marland KitchenRosacea   . Restless leg syndrome     Cyclobenzaprine PRN.  . Nephrolithiasis   . Urinary, incontinence, stress female   . IBS (irritable bowel syndrome)     diarrhea  predominant   Past Surgical History  Procedure Laterality Date  . Neck surgery  1993    2 disc surgery  . Carpal tunnel release  2006    right hand  . Abdominal hysterectomy  03/06/1975    ovaries intact.  Vaginal/uterine mass benign with hemorrhage.  . Cholecystectomy     Allergies  Allergen Reactions  . Codeine Itching  . Penicillins Rash  . Phenergan [Promethazine Hcl] Rash   Current Outpatient Prescriptions  Medication Sig Dispense Refill  . albuterol (PROVENTIL HFA;VENTOLIN HFA) 108 (90 BASE) MCG/ACT inhaler Inhale 2 puffs into the lungs every 6 (six) hours as needed. 18 g 11  . beclomethasone (QVAR) 40 MCG/ACT inhaler Inhale 2 puffs into the lungs 2 (two) times daily. 1 Inhaler 12  . cetirizine (ZYRTEC) 10 MG tablet Take 1 tablet (10 mg total) by mouth daily. 30 tablet 11  . cyclobenzaprine (FLEXERIL) 5 MG tablet Take 1 tablet (5 mg total) by mouth at bedtime. 30 tablet 3  . doxycycline (VIBRAMYCIN) 100 MG capsule Take 1 capsule (100 mg total) by mouth 2 (two) times daily. 60 capsule 3  . EPINEPHrine 0.3 mg/0.3 mL IJ SOAJ injection Inject 0.3 mLs (0.3 mg total) into the muscle once. 1 Device 1  . Fish Oil-Cholecalciferol (FISH OIL + D3) 1200-1000 MG-UNIT CAPS Take 1 capsule by mouth daily.    . hydrochlorothiazide (HYDRODIURIL) 12.5 MG tablet Take 1-2 tablets (12.5-25 mg total) by mouth daily. 60 tablet 3  . Ibuprofen (ADVIL PO) Take by mouth.    . Multiple Vitamin (MULTI-VITAMIN DAILY PO) Take by mouth.    .Marland Kitchen  NIFEdipine (PROCARDIA XL) 60 MG 24 hr tablet Take 1-2 tablets by mouth daily (Patient not taking: Reported on 08/04/2014) 60 tablet 5  . zoster vaccine live, PF, (ZOSTAVAX) 02725 UNT/0.65ML injection Inject 19,400 Units into the skin once. (Patient not taking: Reported on 07/14/2014) 0.65 mL 0   No current facility-administered medications for this visit.   History   Social History  . Marital Status: Married    Spouse Name: N/A  . Number of Children: N/A  . Years of  Education: N/A   Occupational History  . Not on file.   Social History Main Topics  . Smoking status: Never Smoker   . Smokeless tobacco: Never Used  . Alcohol Use: 0.0 oz/week    0 Standard drinks or equivalent per week     Comment: rarely  . Drug Use: No  . Sexual Activity: Not on file   Other Topics Concern  . Not on file   Social History Narrative   Marital status: married x  16 years; second marriage; husband is bipolar and alcoholic.         Children: 1 son (37); 2 granddaughters but has no relationship with them.      Lives: with husband      Employment:  Works from home full time. Works for [employer]; enters Advertising account planner; desk job.      Tobacco: none      Alcohol: none      Exercise: none; no exercise since 2015.      Seatbelt: 100%      Guns: none           Objective:    BP 144/85 mmHg  Pulse 85  Temp(Src) 98.5 F (36.9 C) (Oral)  Resp 16  Ht _0  (1.676 m)  Wt 215 lb 6.4 oz (97.705 kg)  BMI 34.78 kg/m2  SpO2 97% Physical Exam  Constitutional: She is oriented to person, place, and time. She appears well-developed and well-nourished. No distress.  HENT:  Head: Normocephalic and atraumatic.  Right Ear: External ear normal.  Left Ear: External ear normal.  Nose: Nose normal.  Mouth/Throat: Oropharynx is clear and moist.  Eyes: Conjunctivae and EOM are normal. Pupils are equal, round, and reactive to light.  Neck: Normal range of motion. Neck supple. Carotid bruit is not present. No thyromegaly present.  Cardiovascular: Normal rate, regular rhythm, normal heart sounds and intact distal pulses.  Exam reveals no gallop and no friction rub.   No murmur heard. Pulmonary/Chest: Effort normal and breath sounds normal. She has no wheezes. She has no rales.  Abdominal: Soft. Bowel sounds are normal. She exhibits no distension and no mass. There is no tenderness. There is no rebound and no guarding.  Lymphadenopathy:    She has no cervical adenopathy.    Neurological: She is alert and oriented to person, place, and time. No cranial nerve deficit.  Skin: Skin is warm and dry. No rash noted. She is not diaphoretic. No erythema. No pallor.  Psychiatric: She has a normal mood and affect. Her behavior is normal.   Results for orders placed or performed in visit on 07/14/14  CBC with Differential/Platelet  Result Value Ref Range   WBC 6.5 4.0 - 10.5 K/uL   RBC 4.47 3.87 - 5.11 MIL/uL   Hemoglobin 14.6 12.0 - 15.0 g/dL   HCT 41.1 36.0 - 46.0 %   MCV 91.9 78.0 - 100.0 fL   MCH 32.7 26.0 - 34.0 pg   MCHC 35.5 30.0 -  36.0 g/dL   RDW 13.2 11.5 - 15.5 %   Platelets 279 150 - 400 K/uL   MPV 11.0 8.6 - 12.4 fL   Neutrophils Relative % 45 43 - 77 %   Neutro Abs 2.9 1.7 - 7.7 K/uL   Lymphocytes Relative 39 12 - 46 %   Lymphs Abs 2.5 0.7 - 4.0 K/uL   Monocytes Relative 13 (H) 3 - 12 %   Monocytes Absolute 0.8 0.1 - 1.0 K/uL   Eosinophils Relative 2 0 - 5 %   Eosinophils Absolute 0.1 0.0 - 0.7 K/uL   Basophils Relative 1 0 - 1 %   Basophils Absolute 0.1 0.0 - 0.1 K/uL   Smear Review Criteria for review not met   Comprehensive metabolic panel  Result Value Ref Range   Sodium 141 135 - 145 mEq/L   Potassium 4.2 3.5 - 5.3 mEq/L   Chloride 98 96 - 112 mEq/L   CO2 30 19 - 32 mEq/L   Glucose, Bld 94 70 - 99 mg/dL   BUN 13 6 - 23 mg/dL   Creat 0.81 0.50 - 1.10 mg/dL   Total Bilirubin 0.5 0.2 - 1.2 mg/dL   Alkaline Phosphatase 69 39 - 117 U/L   AST 22 0 - 37 U/L   ALT 25 0 - 35 U/L   Total Protein 7.7 6.0 - 8.3 g/dL   Albumin 4.1 3.5 - 5.2 g/dL   Calcium 9.7 8.4 - 10.5 mg/dL  POCT urinalysis dipstick  Result Value Ref Range   Color, UA Yellow    Clarity, UA Clear    Glucose, UA Negative    Bilirubin, UA Negative    Ketones, UA Negative    Spec Grav, UA 1.015    Blood, UA Trace-lysed    pH, UA 7.0    Protein, UA Negative    Urobilinogen, UA 0.2    Nitrite, UA Negative    Leukocytes, UA large (3+)        Assessment & Plan:   1.  Essential hypertension   2. Allergic rhinitis due to pollen   3. Memory loss    1. HTN: improved; continue HCTZ 12.29m bid.   2.  Allergic Rhinitis: stable; recommend trial of Allegra for allergies; has stopped Zyrtec. 3. Memory loss; improved with cessation of Zyrtec; pt does not desire further work up at this time.   No orders of the defined types were placed in this encounter.    Return in about 3 months (around 11/04/2014) for recheck blood pressure, cholesterol.    Keimora Swartout MElayne Guerin M.D. [facility]  [phone] [phone]

## 2014-08-04 NOTE — Patient Instructions (Signed)
DASH Eating Plan °DASH stands for "Dietary Approaches to Stop Hypertension." The DASH eating plan is a healthy eating plan that has been shown to reduce high blood pressure (hypertension). Additional health benefits may include reducing the risk of type 2 diabetes mellitus, heart disease, and stroke. The DASH eating plan may also help with weight loss. °WHAT DO I NEED TO KNOW ABOUT THE DASH EATING PLAN? °For the DASH eating plan, you will follow these general guidelines: °· Choose foods with a percent daily value for sodium of less than 5% (as listed on the food label). °· Use salt-free seasonings or herbs instead of table salt or sea salt. °· Check with your health care provider or pharmacist before using salt substitutes. °· Eat lower-sodium products, often labeled as "lower sodium" or "no salt added." °· Eat fresh foods. °· Eat more vegetables, fruits, and low-fat dairy products. °· Choose whole grains. Look for the word "whole" as the first word in the ingredient list. °· Choose fish and skinless chicken or turkey more often than red meat. Limit fish, poultry, and meat to 6 oz (170 g) each day. °· Limit sweets, desserts, sugars, and sugary drinks. °· Choose heart-healthy fats. °· Limit cheese to 1 oz (28 g) per day. °· Eat more home-cooked food and less restaurant, buffet, and fast food. °· Limit fried foods. °· [place] foods using methods other than frying. °· Limit canned vegetables. If you do use them, rinse them well to decrease the sodium. °· When eating at a restaurant, ask that your food be prepared with less salt, or no salt if possible. °WHAT FOODS CAN I EAT? °Seek help from a dietitian for individual calorie needs. °Grains °Whole grain or whole wheat bread. Brown rice. Whole grain or whole wheat pasta. Quinoa, bulgur, and whole grain cereals. Low-sodium cereals. Corn or whole wheat flour tortillas. Whole grain cornbread. Whole grain crackers. Low-sodium crackers. °Vegetables °Fresh or frozen vegetables  (raw, steamed, roasted, or grilled). Low-sodium or reduced-sodium tomato and vegetable juices. Low-sodium or reduced-sodium tomato sauce and paste. Low-sodium or reduced-sodium canned vegetables.  °Fruits °All fresh, canned (in natural juice), or frozen fruits. °Meat and Other Protein Products °Ground beef (85% or leaner), grass-fed beef, or beef trimmed of fat. Skinless chicken or turkey. Ground chicken or turkey. Pork trimmed of fat. All fish and seafood. Eggs. Dried beans, peas, or lentils. Unsalted nuts and seeds. Unsalted canned beans. °Dairy °Low-fat dairy products, such as skim or 1% milk, 2% or reduced-fat cheeses, low-fat ricotta or cottage cheese, or plain low-fat yogurt. Low-sodium or reduced-sodium cheeses. °Fats and Oils °Tub margarines without trans fats. Light or reduced-fat mayonnaise and salad dressings (reduced sodium). Avocado. Safflower, olive, or canola oils. Natural peanut or almond butter. °Other °Unsalted popcorn and pretzels. °The items listed above may not be a complete list of recommended foods or beverages. Contact your dietitian for more options. °WHAT FOODS ARE NOT RECOMMENDED? °Grains °White bread. White pasta. White rice. Refined cornbread. Bagels and croissants. Crackers that contain trans fat. °Vegetables °Creamed or fried vegetables. Vegetables in a cheese sauce. Regular canned vegetables. Regular canned tomato sauce and paste. Regular tomato and vegetable juices. °Fruits °Dried fruits. Canned fruit in light or heavy syrup. Fruit juice. °Meat and Other Protein Products °Fatty cuts of meat. Ribs, chicken wings, bacon, sausage, bologna, salami, chitterlings, fatback, hot dogs, bratwurst, and packaged luncheon meats. Salted nuts and seeds. Canned beans with salt. °Dairy °Whole or 2% milk, cream, half-and-half, and cream cheese. Whole-fat or sweetened yogurt. Full-fat   cheeses or blue cheese. Nondairy creamers and whipped toppings. Processed cheese, cheese spreads, or cheese  curds. °Condiments °Onion and garlic salt, seasoned salt, table salt, and sea salt. Canned and packaged gravies. Worcestershire sauce. Tartar sauce. Barbecue sauce. Teriyaki sauce. Soy sauce, including reduced sodium. Steak sauce. Fish sauce. Oyster sauce. Cocktail sauce. Horseradish. Ketchup and mustard. Meat flavorings and tenderizers. Bouillon cubes. Hot sauce. Tabasco sauce. Marinades. Taco seasonings. Relishes. °Fats and Oils °Butter, stick margarine, lard, shortening, ghee, and bacon fat. Coconut, palm kernel, or palm oils. Regular salad dressings. °Other °Pickles and olives. Salted popcorn and pretzels. °The items listed above may not be a complete list of foods and beverages to avoid. Contact your dietitian for more information. °WHERE CAN I FIND MORE INFORMATION? °National Heart, Lung, and Blood Institute: www.nhlbi.nih.gov/health/health-topics/topics/dash/ °Document Released: 02/08/2011 Document Revised: 07/06/2013 Document Reviewed: 12/24/2012 °ExitCare® Patient Information ©2015 [employer]. This information is not intended to replace advice given to you by your health care provider. Make sure you discuss any questions you have with your health care provider. ° °

## 2014-09-08 ENCOUNTER — Ambulatory Visit: Payer: 59 | Admitting: Family Medicine

## 2014-10-06 ENCOUNTER — Encounter: Payer: Self-pay | Admitting: Family Medicine

## 2014-10-06 ENCOUNTER — Ambulatory Visit (INDEPENDENT_AMBULATORY_CARE_PROVIDER_SITE_OTHER): Payer: 59 | Admitting: Family Medicine

## 2014-10-06 VITALS — BP 120/90 | HR 76 | Temp 98.5°F | Resp 16 | Ht 65.75 in | Wt 217.2 lb

## 2014-10-06 DIAGNOSIS — W57XXXA Bitten or stung by nonvenomous insect and other nonvenomous arthropods, initial encounter: Secondary | ICD-10-CM

## 2014-10-06 DIAGNOSIS — I10 Essential (primary) hypertension: Secondary | ICD-10-CM | POA: Diagnosis not present

## 2014-10-06 DIAGNOSIS — F430 Acute stress reaction: Secondary | ICD-10-CM | POA: Diagnosis not present

## 2014-10-06 DIAGNOSIS — E785 Hyperlipidemia, unspecified: Secondary | ICD-10-CM | POA: Diagnosis not present

## 2014-10-06 DIAGNOSIS — T148 Other injury of unspecified body region: Secondary | ICD-10-CM | POA: Diagnosis not present

## 2014-10-06 LAB — COMPREHENSIVE METABOLIC PANEL
ALK PHOS: 47 U/L (ref 33–130)
ALT: 25 U/L (ref 6–29)
AST: 21 U/L (ref 10–35)
Albumin: 3.5 g/dL — ABNORMAL LOW (ref 3.6–5.1)
BILIRUBIN TOTAL: 0.4 mg/dL (ref 0.2–1.2)
BUN: 13 mg/dL (ref 7–25)
CALCIUM: 9.5 mg/dL (ref 8.6–10.4)
CHLORIDE: 102 mmol/L (ref 98–110)
CO2: 28 mmol/L (ref 20–31)
CREATININE: 0.86 mg/dL (ref 0.50–0.99)
Glucose, Bld: 93 mg/dL (ref 65–99)
Potassium: 4.3 mmol/L (ref 3.5–5.3)
Sodium: 141 mmol/L (ref 135–146)
TOTAL PROTEIN: 6.8 g/dL (ref 6.1–8.1)

## 2014-10-06 MED ORDER — HYDROCHLOROTHIAZIDE 12.5 MG PO TABS: 12.5000 mg | ORAL_TABLET | Freq: Every day | ORAL | Status: DC

## 2014-10-06 NOTE — Progress Notes (Signed)
Subjective:    Patient ID: Mindy Hood, female    DOB: [birth year], 64 y.o.   MRN: [id number]  10/06/2014  Follow-up; Hypertension; and Hyperlipidemia   HPI This 64 y.o. female presents for three month follow-up:  1. HTN: Patient reports good compliance with medication, good tolerance to medication, and good symptom control.  No changes to management made at last visit. Taking HCTZ 12.5mg  bid.  Not checking BP at home; has been too busy.  Feeling well; does not feel like running high.  Has not felt this good in five years.    BP Readings from Last 3 Encounters:  10/06/14 120/90  08/04/14 144/85  07/14/14 138/84   2. Hypercholesterolemia: lipid elevated at visit in 05/2014.  Due for repeat.  Lab Results  Component Value Date   CHOL 228* 05/26/2014   HDL 43* 05/26/2014   LDLCALC 146* 05/26/2014   TRIG 195* 05/26/2014   CHOLHDL 5.3 05/26/2014    3.  Stress reaction: going back to the office.  Escalated calls as Production designer, theatre/television/film; previous experience with escalated calls; does well with trouble shooting.  Took this position.  Heard about this line; will be $8000 raise for patient.  Board with current position.  Wants the escalation line.  Requisition has not come out yet; will be given position for past six weeks.  Had to go through governance.  Position is approved; money has been approved.  Has worked there for 15 years.  No raise in four years.  Goal is 11/04/2014; October 1 is back up date.  There will be two weeks where pt will be on vacation.  Husband is not happy.  Will change his schedule.  Falls asleep at work. Must be around people, changes, money.   Mentally drained; grandchild is bubble child; allergic to everything.  Cannot come to pt's house because of allergy thing; pt has pets that grandchild is allergic to.  Has outgrown some allergies; granddaughter wanted to come for dinner at end of month. Son called pt last week; asked to come for dinner.  Requested lasagna.  Oldest granddaughter has  gone vegetarian; cooked her salmon.  At 6:30, granddaughter had hives; dog licked granddaughter.   In floor wrestling with two pups.  Patches all over the child; administered benadryl.     4. Tick bites: three in past thirty days.  All deer ticks.  This last one, not sure how long latched; two hours.  Removed with alcohol. With previous diagnosis with Lymes, did not get bull's eye and rash.  With this tick bite, did get bull's eye and rash.  Took Doxycycline twice daily for five days. Was advised that only needed to take for three days but took for five days because of arthralgias.    Review of Systems  Constitutional: Negative for fever, chills, diaphoresis and fatigue.  Eyes: Negative for visual disturbance.  Respiratory: Negative for cough and shortness of breath.   Cardiovascular: Negative for chest pain, palpitations and leg swelling.  Gastrointestinal: Negative for nausea, vomiting, abdominal pain, diarrhea and constipation.  Endocrine: Negative for cold intolerance, heat intolerance, polydipsia, polyphagia and polyuria.  Neurological: Negative for dizziness, tremors, seizures, syncope, facial asymmetry, speech difficulty, weakness, light-headedness, numbness and headaches.  Psychiatric/Behavioral: Positive for sleep disturbance. Negative for suicidal ideas, self-injury and dysphoric mood. The patient is nervous/anxious.     Past Medical History  Diagnosis Date  . Allergy   . Asthma   . Hypertension   . Hyperlipidemia   . Fibromyalgia 2002  treated for Lyme's disease in 2001.  Marland Kitchen Rosacea   . Restless leg syndrome     Cyclobenzaprine PRN.  . Nephrolithiasis   . Urinary, incontinence, stress female   . IBS (irritable bowel syndrome)     diarrhea predominant   Past Surgical History  Procedure Laterality Date  . Neck surgery  1993    2 disc surgery  . Carpal tunnel release  2006    right hand  . Abdominal hysterectomy  03/06/1975    ovaries intact.  Vaginal/uterine mass  benign with hemorrhage.  . Cholecystectomy     Allergies  Allergen Reactions  . Codeine Itching  . Penicillins Rash  . Phenergan [Promethazine Hcl] Rash   Current Outpatient Prescriptions  Medication Sig Dispense Refill  . albuterol (PROVENTIL HFA;VENTOLIN HFA) 108 (90 BASE) MCG/ACT inhaler Inhale 2 puffs into the lungs every 6 (six) hours as needed. 18 g 11  . beclomethasone (QVAR) 40 MCG/ACT inhaler Inhale 2 puffs into the lungs 2 (two) times daily. 1 Inhaler 12  . cetirizine (ZYRTEC) 10 MG tablet Take 1 tablet (10 mg total) by mouth daily. (Patient taking differently: Take 5 mg by mouth daily. ) 30 tablet 11  . cyclobenzaprine (FLEXERIL) 5 MG tablet Take 1 tablet (5 mg total) by mouth at bedtime. 30 tablet 3  . doxycycline (VIBRAMYCIN) 100 MG capsule Take 1 capsule (100 mg total) by mouth 2 (two) times daily. 60 capsule 3  . EPINEPHrine 0.3 mg/0.3 mL IJ SOAJ injection Inject 0.3 mLs (0.3 mg total) into the muscle once. 1 Device 1  . Fish Oil-Cholecalciferol (FISH OIL + D3) 1200-1000 MG-UNIT CAPS Take 1 capsule by mouth daily.    . hydrochlorothiazide (HYDRODIURIL) 12.5 MG tablet Take 1-2 tablets (12.5-25 mg total) by mouth daily. 60 tablet 5  . Ibuprofen (ADVIL PO) Take by mouth.    . Multiple Vitamin (MULTI-VITAMIN DAILY PO) Take by mouth.    Marland Kitchen NIFEdipine (PROCARDIA XL) 60 MG 24 hr tablet Take 1-2 tablets by mouth daily 60 tablet 5  . zoster vaccine live, PF, (ZOSTAVAX) 65784 UNT/0.65ML injection Inject 19,400 Units into the skin once. 0.65 mL 0   No current facility-administered medications for this visit.   Social History   Social History  . Marital Status: Married    Spouse Name: N/A  . Number of Children: N/A  . Years of Education: N/A   Occupational History  . Not on file.   Social History Main Topics  . Smoking status: Never Smoker   . Smokeless tobacco: Never Used  . Alcohol Use: 0.0 oz/week    0 Standard drinks or equivalent per week     Comment: rarely  . Drug  Use: No  . Sexual Activity: Not on file   Other Topics Concern  . Not on file   Social History Narrative   Marital status: married x  16 years; second marriage; husband is bipolar and alcoholic.         Children: 1 son (70); 2 granddaughters but has no relationship with them.      Lives: with husband      Employment:  Works from home full time. Works for [employer]; enters Radiation protection practitioner; desk job.      Tobacco: none      Alcohol: none      Exercise: none; no exercise since 2015.      Seatbelt: 100%      Guns: none      Family History  Problem Relation Age  of Onset  . Diverticulitis Sister   . Hyperlipidemia Sister   . Hypertension Sister   . Fibromyalgia Sister   . Hypertension Sister   . Hyperlipidemia Sister   . Hypertension Mother   . Hyperlipidemia Mother   . Alcohol abuse Mother   . Cancer Mother     tongue/oral, throat, bladder cancer with mets  . Alcohol abuse Father   . Heart disease Father 76    AMI s/p CABG  .30 Cirrhosis Father        Objective:    BP 120/90 mmHg  Pulse 76  Temp(Src) 98.5 F (36.9 C) (Oral)  Resp 16  Ht 5' 5.75" (1.67 m)  Wt 217 lb 3.2 oz (98.521 kg)  BMI 35.33 kg/m2  SpO2 97% Physical Exam  Constitutional: She is oriented to person, place, and time. She appears well-developed and well-nourished. No distress.  HENT:  Head: Normocephalic and atraumatic.  Right Ear: External ear normal.  Left Ear: External ear normal.  Nose: Nose normal.  Mouth/Throat: Oropharynx is clear and moist.  Eyes: Conjunctivae and EOM are normal. Pupils are equal, round, and reactive to light.  Neck: Normal range of motion. Neck supple. Carotid bruit is not present. No thyromegaly present.  Cardiovascular: Normal rate, regular rhythm, normal heart sounds and intact distal pulses.  Exam reveals no gallop and no friction rub.   No murmur heard. Pulmonary/Chest: Effort normal and breath sounds normal. She has no wheezes. She has no rales.  Abdominal: Soft.  Bowel sounds are normal. She exhibits no distension and no mass. There is no tenderness. There is no rebound and no guarding.  Lymphadenopathy:    She has no cervical adenopathy.  Neurological: She is alert and oriented to person, place, and time. No cranial nerve deficit.  Skin: Skin is warm and dry. No rash noted. She is not diaphoretic. No erythema. No pallor.  Psychiatric: She has a normal mood and affect. Her behavior is normal.        Assessment & Plan:   1. Hyperlipidemia   2. Essential hypertension, benign   3. Stress reaction   4. Tick bites    1. Hyperlipidemia: uncontrolled; recommend weight loss, exercise, low-cholesterol food choices. 2. HTN: controlled; obtain labs; continue current medications. 3.  Stress reaction: counseling provided today.  Recommend regular exercise for stress management. 4. Tick bites; New; history of Lyme's disease; s/p treatment with Doxycycline.     Orders Placed This Encounter  Procedures  . Comprehensive metabolic panel    Order Specific Question:  Has the patient fasted?    Answer:  Yes    Meds ordered this encounter  Medications  . hydrochlorothiazide (HYDRODIURIL) 12.5 MG tablet    Sig: Take 1-2 tablets (12.5-25 mg total) by mouth daily.    Dispense:  60 tablet    Refill:  5    Return in about 4 months (around 02/05/2015) for recheck.     Derian Pfost Paulita Fujita, M.D. [facility] [address] [place], [place]  [address] [phone] phone [phone] fax

## 2014-10-06 NOTE — Patient Instructions (Signed)

## 2014-12-06 ENCOUNTER — Encounter: Payer: Self-pay | Admitting: Family Medicine

## 2014-12-06 ENCOUNTER — Other Ambulatory Visit: Payer: Self-pay | Admitting: Family Medicine

## 2014-12-06 ENCOUNTER — Telehealth: Payer: Self-pay | Admitting: Family Medicine

## 2014-12-06 ENCOUNTER — Ambulatory Visit (INDEPENDENT_AMBULATORY_CARE_PROVIDER_SITE_OTHER): Payer: 59

## 2014-12-06 ENCOUNTER — Ambulatory Visit (INDEPENDENT_AMBULATORY_CARE_PROVIDER_SITE_OTHER): Payer: 59 | Admitting: Family Medicine

## 2014-12-06 VITALS — BP 124/90 | HR 93 | Temp 98.1°F | Resp 16 | Ht 66.0 in | Wt 219.0 lb

## 2014-12-06 DIAGNOSIS — R229 Localized swelling, mass and lump, unspecified: Secondary | ICD-10-CM | POA: Diagnosis not present

## 2014-12-06 DIAGNOSIS — Z1231 Encounter for screening mammogram for malignant neoplasm of breast: Secondary | ICD-10-CM

## 2014-12-06 DIAGNOSIS — R222 Localized swelling, mass and lump, trunk: Secondary | ICD-10-CM

## 2014-12-06 NOTE — Telephone Encounter (Signed)
Spoke with patient about mammogram.  She is going to go by [facility] and see if they could possibly fit her in this week while she is off.  I asked her to have them fax [place] a copy of the report for our records.  Also scheduled her an appointment with Dr. Katrinka Blazing for today at 1:30 for a knot on her back,.

## 2014-12-06 NOTE — Progress Notes (Signed)
Subjective:    Patient ID: Mindy Hood, female    DOB: [birth year], 64 y.o.   MRN: [id number]  12/06/2014  Follow-up and knot in lower left side of back   HPI This 64 y.o. female presents for evaluation ofL back lump.  Ten days ago, awoke and did not feel right.  Put hand on L back and felt a lump.  Went and had a full body massage recently so not sure if feeling knotted muscles.  There is no more pain and not in spasms.  When bends over, not as noticeable.  When laying down flat, not present.   Scheduled for mammogram in the morning.     Review of Systems  Constitutional: Negative for fever, chills, diaphoresis and fatigue.  Gastrointestinal: Negative for abdominal pain.  Genitourinary: Negative for dysuria, decreased urine volume and difficulty urinating.  Musculoskeletal: Negative for myalgias, back pain, joint swelling and gait problem.  Skin: Negative for rash and wound.  Neurological: Negative for weakness and numbness.    Past Medical History  Diagnosis Date  . Allergy   . Asthma   . Hypertension   . Hyperlipidemia   . Fibromyalgia 2002    treated for Lyme's disease in 2001.  Marland Kitchen Rosacea   . Restless leg syndrome     Cyclobenzaprine PRN.  . Nephrolithiasis   . Urinary, incontinence, stress female   . IBS (irritable bowel syndrome)     diarrhea predominant   Past Surgical History  Procedure Laterality Date  . Neck surgery  1993    2 disc surgery  . Carpal tunnel release  2006    right hand  . Abdominal hysterectomy  03/06/1975    ovaries intact.  Vaginal/uterine mass benign with hemorrhage.  . Cholecystectomy     Allergies  Allergen Reactions  . Codeine Itching  . Penicillins Rash  . Phenergan [Promethazine Hcl] Rash   Current Outpatient Prescriptions  Medication Sig Dispense Refill  . albuterol (PROVENTIL HFA;VENTOLIN HFA) 108 (90 BASE) MCG/ACT inhaler Inhale 2 puffs into the lungs every 6 (six) hours as needed. 18 g 11  . beclomethasone (QVAR) 40 MCG/ACT  inhaler Inhale 2 puffs into the lungs 2 (two) times daily. 1 Inhaler 12  . cetirizine (ZYRTEC) 10 MG tablet Take 1 tablet (10 mg total) by mouth daily. (Patient taking differently: Take 5 mg by mouth daily. ) 30 tablet 11  . cyclobenzaprine (FLEXERIL) 5 MG tablet Take 1 tablet (5 mg total) by mouth at bedtime. 30 tablet 3  . doxycycline (VIBRAMYCIN) 100 MG capsule Take 1 capsule (100 mg total) by mouth 2 (two) times daily. 60 capsule 3  . EPINEPHrine 0.3 mg/0.3 mL IJ SOAJ injection Inject 0.3 mLs (0.3 mg total) into the muscle once. 1 Device 1  . Fish Oil-Cholecalciferol (FISH OIL + D3) 1200-1000 MG-UNIT CAPS Take 1 capsule by mouth daily.    . hydrochlorothiazide (HYDRODIURIL) 12.5 MG tablet Take 1-2 tablets (12.5-25 mg total) by mouth daily. 60 tablet 5  . Ibuprofen (ADVIL PO) Take by mouth.    . Multiple Vitamin (MULTI-VITAMIN DAILY PO) Take by mouth.    . zoster vaccine live, PF, (ZOSTAVAX) 40981 UNT/0.65ML injection Inject 19,400 Units into the skin once. (Patient not taking: Reported on 12/06/2014) 0.65 mL 0   No current facility-administered medications for this visit.   Social History   Social History  . Marital Status: Married    Spouse Name: N/A  . Number of Children: N/A  . Years of Education: N/A  Occupational History  . Not on file.   Social History Main Topics  . Smoking status: Never Smoker   . Smokeless tobacco: Never Used  . Alcohol Use: 0.0 oz/week    0 Standard drinks or equivalent per week     Comment: rarely  . Drug Use: No  . Sexual Activity: Not on file   Other Topics Concern  . Not on file   Social History Narrative   Marital status: married x  16 years; second marriage; husband is bipolar and alcoholic.         Children: 1 son (107); 2 granddaughters but has no relationship with them.      Lives: with husband      Employment:  Works from home full time. Works for [employer]; enters Radiation protection practitioner; desk job.      Tobacco: none      Alcohol: none       Exercise: none; no exercise since 2015.      Seatbelt: 100%      Guns: none      Family History  Problem Relation Age of Onset  . Diverticulitis Sister   . Hyperlipidemia Sister   . Hypertension Sister   . Fibromyalgia Sister   . Hypertension Sister   . Hyperlipidemia Sister   . Hypertension Mother   . Hyperlipidemia Mother   . Alcohol abuse Mother   . Cancer Mother     tongue/oral, throat, bladder cancer with mets  . Alcohol abuse Father   . Heart disease Father 53    AMI s/p CABG  . Cirrhosis Father        Objective:    BP 124/90 mmHg  Pulse 93  Temp(Src) 98.1 F (36.7 C) (Oral)  Resp 16  Ht  (1.676 m)  Wt 219 lb (99.338 kg)  BMI 35.36 kg/m2  SpO2 96% Physical Exam  Constitutional: She is oriented to person, place, and time. She appears well-developed and well-nourished. No distress.  HENT:  Head: Normocephalic and atraumatic.  Eyes: Conjunctivae are normal. Pupils are equal, round, and reactive to light.  Neck: Normal range of motion. Neck supple.  Cardiovascular: Normal rate, regular rhythm and normal heart sounds.  Exam reveals no gallop and no friction rub.   No murmur heard. Pulmonary/Chest: Effort normal and breath sounds normal. She has no wheezes. She has no rales.  Musculoskeletal:       Thoracic back: She exhibits swelling. She exhibits normal range of motion, no tenderness and no bony tenderness.       Lumbar back: She exhibits swelling. She exhibits normal range of motion, no tenderness, no bony tenderness, no deformity, no laceration, no pain, no spasm and normal pulse.  +possible mass versus soft tissue mass along L thoracic paraspinal region.  Neurological: She is alert and oriented to person, place, and time.  Skin: She is not diaphoretic.  Psychiatric: She has a normal mood and affect. Her behavior is normal.  Nursing note and vitals reviewed.  Results for orders placed or performed in visit on 10/06/14  Comprehensive metabolic panel    Result Value Ref Range   Sodium 141 135 - 146 mmol/L   Potassium 4.3 3.5 - 5.3 mmol/L   Chloride 102 98 - 110 mmol/L   CO2 28 20 - 31 mmol/L   Glucose, Bld 93 65 - 99 mg/dL   BUN 13 7 - 25 mg/dL   Creat 1.61 0.96 - 0.45 mg/dL   Total Bilirubin 0.4 0.2 - 1.2  mg/dL   Alkaline Phosphatase 47 33 - 130 U/L   AST 21 10 - 35 U/L   ALT 25 6 - 29 U/L   Total Protein 6.8 6.1 - 8.1 g/dL   Albumin 3.5 (L) 3.6 - 5.1 g/dL   Calcium 9.5 8.6 - 13.0 mg/dL   [facility] reading (PRIMARY) by  Dr. Katrinka Blazing.  L RIBS:  NAD.      Assessment & Plan:   1. Mass on back     Orders Placed This Encounter  Procedures  . DG Ribs Unilateral W/Chest Left    Standing Status: Future     Number of Occurrences: 1     Standing Expiration Date: 12/06/2015    Order Specific Question:  Reason for Exam (SYMPTOM  OR DIAGNOSIS REQUIRED)    Answer:  L posterior rib swelling/mass    Order Specific Question:  Preferred imaging location?    Answer:  External   No orders of the defined types were placed in this encounter.    No Follow-up on file.    Kristi Paulita Fujita, M.D. [facility] [address] [place], [place]  [address] [phone] phone [phone] fax

## 2014-12-07 ENCOUNTER — Ambulatory Visit
Admission: RE | Admit: 2014-12-07 | Discharge: 2014-12-07 | Disposition: A | Discharge status: Home / Self Care | Payer: 59 | Source: Ambulatory Visit | Attending: Family Medicine | Admitting: Family Medicine

## 2014-12-07 DIAGNOSIS — Z1231 Encounter for screening mammogram for malignant neoplasm of breast: Secondary | ICD-10-CM

## 2014-12-08 ENCOUNTER — Other Ambulatory Visit: Payer: Self-pay | Admitting: Family Medicine

## 2014-12-08 DIAGNOSIS — R928 Other abnormal and inconclusive findings on diagnostic imaging of breast: Secondary | ICD-10-CM

## 2014-12-15 ENCOUNTER — Ambulatory Visit
Admission: RE | Admit: 2014-12-15 | Discharge: 2014-12-15 | Disposition: A | Discharge status: Home / Self Care | Payer: 59 | Source: Ambulatory Visit | Attending: Family Medicine | Admitting: Family Medicine

## 2014-12-15 DIAGNOSIS — R928 Other abnormal and inconclusive findings on diagnostic imaging of breast: Secondary | ICD-10-CM

## 2015-02-07 ENCOUNTER — Ambulatory Visit: Payer: Self-pay | Admitting: Family Medicine

## 2015-04-08 ENCOUNTER — Ambulatory Visit: Payer: 59 | Admitting: Family Medicine

## 2015-04-12 ENCOUNTER — Ambulatory Visit (INDEPENDENT_AMBULATORY_CARE_PROVIDER_SITE_OTHER): Payer: 59 | Admitting: Family Medicine

## 2015-04-12 ENCOUNTER — Encounter: Payer: Self-pay | Admitting: Family Medicine

## 2015-04-12 VITALS — BP 122/84 | HR 91 | Temp 98.1°F | Resp 16 | Ht 66.0 in | Wt 215.0 lb

## 2015-04-12 DIAGNOSIS — M797 Fibromyalgia: Secondary | ICD-10-CM

## 2015-04-12 DIAGNOSIS — L719 Rosacea, unspecified: Secondary | ICD-10-CM

## 2015-04-12 DIAGNOSIS — I10 Essential (primary) hypertension: Secondary | ICD-10-CM

## 2015-04-12 DIAGNOSIS — Z9109 Other allergy status, other than to drugs and biological substances: Secondary | ICD-10-CM | POA: Diagnosis not present

## 2015-04-12 DIAGNOSIS — G2581 Restless legs syndrome: Secondary | ICD-10-CM

## 2015-04-12 DIAGNOSIS — E785 Hyperlipidemia, unspecified: Secondary | ICD-10-CM | POA: Diagnosis not present

## 2015-04-12 DIAGNOSIS — Z23 Encounter for immunization: Secondary | ICD-10-CM

## 2015-04-12 DIAGNOSIS — R7302 Impaired glucose tolerance (oral): Secondary | ICD-10-CM

## 2015-04-12 DIAGNOSIS — J302 Other seasonal allergic rhinitis: Secondary | ICD-10-CM

## 2015-04-12 DIAGNOSIS — Z889 Allergy status to unspecified drugs, medicaments and biological substances status: Secondary | ICD-10-CM

## 2015-04-12 DIAGNOSIS — J45909 Unspecified asthma, uncomplicated: Secondary | ICD-10-CM

## 2015-04-12 LAB — CBC WITH DIFFERENTIAL/PLATELET
BASOS ABS: 0.1 10*3/uL (ref 0.0–0.1)
BASOS PCT: 1 % (ref 0–1)
EOS ABS: 0.2 10*3/uL (ref 0.0–0.7)
EOS PCT: 3 % (ref 0–5)
HCT: 42.7 % (ref 36.0–46.0)
Hemoglobin: 14.7 g/dL (ref 12.0–15.0)
LYMPHS ABS: 2.2 10*3/uL (ref 0.7–4.0)
Lymphocytes Relative: 37 % (ref 12–46)
MCH: 32.2 pg (ref 26.0–34.0)
MCHC: 34.4 g/dL (ref 30.0–36.0)
MCV: 93.6 fL (ref 78.0–100.0)
MPV: 11 fL (ref 8.6–12.4)
Monocytes Absolute: 0.7 10*3/uL (ref 0.1–1.0)
Monocytes Relative: 12 % (ref 3–12)
NEUTROS PCT: 47 % (ref 43–77)
Neutro Abs: 2.8 10*3/uL (ref 1.7–7.7)
PLATELETS: 283 10*3/uL (ref 150–400)
RBC: 4.56 MIL/uL (ref 3.87–5.11)
RDW: 13 % (ref 11.5–15.5)
WBC: 6 10*3/uL (ref 4.0–10.5)

## 2015-04-12 LAB — COMPREHENSIVE METABOLIC PANEL
ALT: 26 U/L (ref 6–29)
AST: 23 U/L (ref 10–35)
Albumin: 4.1 g/dL (ref 3.6–5.1)
Alkaline Phosphatase: 52 U/L (ref 33–130)
BUN: 15 mg/dL (ref 7–25)
CHLORIDE: 99 mmol/L (ref 98–110)
CO2: 30 mmol/L (ref 20–31)
CREATININE: 0.84 mg/dL (ref 0.50–0.99)
Calcium: 9.5 mg/dL (ref 8.6–10.4)
GLUCOSE: 96 mg/dL (ref 65–99)
POTASSIUM: 4.2 mmol/L (ref 3.5–5.3)
SODIUM: 141 mmol/L (ref 135–146)
TOTAL PROTEIN: 7.3 g/dL (ref 6.1–8.1)
Total Bilirubin: 0.5 mg/dL (ref 0.2–1.2)

## 2015-04-12 LAB — LIPID PANEL
CHOL/HDL RATIO: 5.2 ratio — AB (ref <=5.0)
CHOLESTEROL: 257 mg/dL — AB (ref 125–200)
HDL: 49 mg/dL (ref >=46)
LDL CALC: 172 mg/dL — AB (ref <130)
Triglycerides: 181 mg/dL — ABNORMAL HIGH (ref <150)
VLDL: 36 mg/dL — AB (ref <30)

## 2015-04-12 MED ORDER — CYCLOBENZAPRINE HCL 5 MG PO TABS: 5.0000 mg | ORAL_TABLET | Freq: Every day | ORAL | Status: DC

## 2015-04-12 MED ORDER — DOXYCYCLINE HYCLATE 100 MG PO TABS: 100.0000 mg | ORAL_TABLET | Freq: Two times a day (BID) | ORAL | Status: DC

## 2015-04-12 MED ORDER — HYDROCHLOROTHIAZIDE 12.5 MG PO TABS: 12.5000 mg | ORAL_TABLET | Freq: Every day | ORAL | Status: DC

## 2015-04-12 MED ORDER — ZOSTER VACCINE LIVE 19400 UNT/0.65ML ~~LOC~~ SOLR: 0.6500 mL | Freq: Once | SUBCUTANEOUS | Status: DC

## 2015-04-12 MED ORDER — CETIRIZINE HCL 10 MG PO TABS: 10.0000 mg | ORAL_TABLET | Freq: Every day | ORAL | Status: DC

## 2015-04-12 NOTE — Patient Instructions (Signed)
Managing Your High Blood Pressure °Blood pressure is a measurement of how forceful your blood is pressing against the walls of the arteries. Arteries are muscular tubes within the circulatory system. Blood pressure does not stay the same. Blood pressure rises when you are active, excited, or nervous; and it lowers during sleep and relaxation. If the numbers measuring your blood pressure stay above normal most of the time, you are at risk for health problems. High blood pressure (hypertension) is a long-term (chronic) condition in which blood pressure is elevated. °A blood pressure reading is recorded as two numbers, such as 120 over 80 (or 120/80). The first, higher number is called the systolic pressure. It is a measure of the pressure in your arteries as the heart beats. The second, lower number is called the diastolic pressure. It is a measure of the pressure in your arteries as the heart relaxes between beats.  °Keeping your blood pressure in a normal range is important to your overall health and prevention of health problems, such as heart disease and stroke. When your blood pressure is uncontrolled, your heart has to work harder than normal. High blood pressure is a very common condition in adults because blood pressure tends to rise with age. Men and women are equally likely to have hypertension but at different times in life. Before age 45, men are more likely to have hypertension. After 65 years of age, women are more likely to have it. Hypertension is especially common in African Americans. This condition often has no signs or symptoms. The cause of the condition is usually not known. Your caregiver can help you come up with a plan to keep your blood pressure in a normal, healthy range. °BLOOD PRESSURE STAGES °Blood pressure is classified into four stages: normal, prehypertension, stage 1, and stage 2. Your blood pressure reading will be used to determine what type of treatment, if any, is necessary.  Appropriate treatment options are tied to these four stages:  °Normal °· Systolic pressure (mm Hg): below 120. °· Diastolic pressure (mm Hg): below 80. °Prehypertension °· Systolic pressure (mm Hg): 120 to 139. °· Diastolic pressure (mm Hg): 80 to 89. °Stage 1 °· Systolic pressure (mm Hg): 140 to 159. °· Diastolic pressure (mm Hg): 90 to 99. °Stage 2 °· Systolic pressure (mm Hg): 160 or above. °· Diastolic pressure (mm Hg): 100 or above. °RISKS RELATED TO HIGH BLOOD PRESSURE °Managing your blood pressure is an important responsibility. Uncontrolled high blood pressure can lead to: °· A heart attack. °· A stroke. °· A weakened blood vessel (aneurysm). °· Heart failure. °· Kidney damage. °· Eye damage. °· Metabolic syndrome. °· Memory and concentration problems. °HOW TO MANAGE YOUR BLOOD PRESSURE °Blood pressure can be managed effectively with lifestyle changes and medicines (if needed). Your caregiver will help you come up with a plan to bring your blood pressure within a normal range. Your plan should include the following: °Education °· Read all information provided by your caregivers about how to control blood pressure. °· Educate yourself on the latest guidelines and treatment recommendations. New research is always being done to further define the risks and treatments for high blood pressure. °Lifestyle changes °· Control your weight. °· Avoid smoking. °· Stay physically active. °· Reduce the amount of salt in your diet. °· Reduce stress. °· Control any chronic conditions, such as high cholesterol or diabetes. °· Reduce your alcohol intake. °Medicines °· Several medicines (antihypertensive medicines) are available, if needed, to bring blood pressure within a normal range. °  Communication °· Review all the medicines you take with your caregiver because there may be side effects or interactions. °· Talk with your caregiver about your diet, exercise habits, and other lifestyle factors that may be contributing to  high blood pressure. °· See your caregiver regularly. Your caregiver can help you create and adjust your plan for managing high blood pressure. °RECOMMENDATIONS FOR TREATMENT AND FOLLOW-UP  °The following recommendations are based on current guidelines for managing high blood pressure in nonpregnant adults. Use these recommendations to identify the proper follow-up period or treatment option based on your blood pressure reading. You can discuss these options with your caregiver. °· Systolic pressure of 120 to 139 or diastolic pressure of 80 to 89: Follow up with your caregiver as directed. °· Systolic pressure of 140 to 160 or diastolic pressure of 90 to 100: Follow up with your caregiver within 2 months. °· Systolic pressure above 160 or diastolic pressure above 100: Follow up with your caregiver within 1 month. °· Systolic pressure above 180 or diastolic pressure above 110: Consider antihypertensive therapy; follow up with your caregiver within 1 week. °· Systolic pressure above 200 or diastolic pressure above 120: Begin antihypertensive therapy; follow up with your caregiver within 1 week. °  °This information is not intended to replace advice given to you by your health care provider. Make sure you discuss any questions you have with your health care provider. °  °Document Released: 11/14/2011 Document Reviewed: 11/14/2011 °Elsevier Interactive Patient Education ©2016 Elsevier Inc. ° °

## 2015-04-12 NOTE — Progress Notes (Signed)
Subjective:    Patient ID: Mindy Hood, female    DOB: [birth year], 65 y.o.   MRN: [id number]  04/12/2015  Follow-up and Medication Refill   HPI This 65 y.o. female presents for six month follow-up:   1. HTN: Patient reports good compliance with medication, good tolerance to medication, and good symptom control.   Usually runs 128-136/75-85.  Doing really well.  Tries to be within 12 hours of medications.  HCTZ 12.5mg  every 12 hours.    2.  Anxiety and depression:  At last visit, transitioned into work; Interior and spatial designer fried pt; pt going today to speak with her; calmer than was.  Went into office; waiting for line to go back into office; placed on hold; talked into supervisor position; knew not guaranteed the position; had 1.5 hour interview with panel; second interview; felt good about interview; received email that rejected.  Had completed all interviews with meeting; supervisor made final decision.  With company fifteen years.  The person hired had been with company two years.  Still working from home; $4000 raise.  Solved problem with husband; bought head phones for husband; going to get to travel with company this year.  Traveling to [place] for three weeks; traveling to [place] for three weeks this summer.  Great opportunity to grow.  Position hired Naval architect, Pensions consultant, Leisure centre manager, and provider data specialist.  Alena Bills to travel.  Always traveled with father.  Camped as a child.  Traveled with son until age 18.  Talks to people all day.  Emotionally the best place in the past two years.  Husband bipolar, alcoholism.  Husband hard of hearing.  Virtual training of classes on web.      Review of Systems  Constitutional: Negative for fever, chills, diaphoresis and fatigue.  Eyes: Negative for visual disturbance.  Respiratory: Negative for cough and shortness of breath.   Cardiovascular: Negative for chest pain, palpitations and leg swelling.  Gastrointestinal: Negative for nausea, vomiting,  abdominal pain, diarrhea and constipation.  Endocrine: Negative for cold intolerance, heat intolerance, polydipsia, polyphagia and polyuria.  Neurological: Negative for dizziness, tremors, seizures, syncope, facial asymmetry, speech difficulty, weakness, light-headedness, numbness and headaches.    Past Medical History  Diagnosis Date  . Allergy   . Asthma   . Hypertension   . Hyperlipidemia   . Fibromyalgia 2002    treated for Lyme's disease in 2001.  Marland Kitchen Rosacea   . Restless leg syndrome     Cyclobenzaprine PRN.  . Nephrolithiasis   . Urinary, incontinence, stress female   . IBS (irritable bowel syndrome)     diarrhea predominant   Past Surgical History  Procedure Laterality Date  . Neck surgery  1993    2 disc surgery  . Carpal tunnel release  2006    right hand  . Abdominal hysterectomy  03/06/1975    ovaries intact.  Vaginal/uterine mass benign with hemorrhage.  . Cholecystectomy     Allergies  Allergen Reactions  . Codeine Itching  . Penicillins Rash  . Phenergan [Promethazine Hcl] Rash    Social History   Social History  . Marital Status: Married    Spouse Name: N/A  . Number of Children: N/A  . Years of Education: N/A   Occupational History  . Not on file.   Social History Main Topics  . Smoking status: Never Smoker   . Smokeless tobacco: Never Used  . Alcohol Use: 0.0 oz/week    0 Standard drinks or equivalent per week  Comment: rarely  . Drug Use: No  . Sexual Activity: Not on file   Other Topics Concern  . Not on file   Social History Narrative   Marital status: married x  16 years; second marriage; husband is bipolar and alcoholic.         Children: 1 son (63); 2 granddaughters but has no relationship with them.      Lives: with husband      Employment:  Works from home full time. Works for [employer]; enters Radiation protection practitioner; desk job.      Tobacco: none      Alcohol: none      Exercise: none; no exercise since 2015.      Seatbelt: 100%       Guns: none      Family History  Problem Relation Age of Onset  . Diverticulitis Sister   . Hyperlipidemia Sister   . Hypertension Sister   . Fibromyalgia Sister   . Hypertension Sister   . Hyperlipidemia Sister   . Hypertension Mother   . Hyperlipidemia Mother   . Alcohol abuse Mother   . Cancer Mother     tongue/oral, throat, bladder cancer with mets  . Alcohol abuse Father   . Heart disease Father 57    AMI s/p CABG  . Cirrhosis Father        Objective:    BP 122/84 mmHg  Pulse 91  Temp(Src) 98.1 F (36.7 C) (Oral)  Resp 16  Ht  (1.676 m)  Wt 215 lb (97.523 kg)  BMI 34.72 kg/m2  SpO2 97% Physical Exam  Constitutional: She is oriented to person, place, and time. She appears well-developed and well-nourished. No distress.  HENT:  Head: Normocephalic and atraumatic.  Right Ear: External ear normal.  Left Ear: External ear normal.  Nose: Nose normal.  Mouth/Throat: Oropharynx is clear and moist.  Eyes: Conjunctivae and EOM are normal. Pupils are equal, round, and reactive to light.  Neck: Normal range of motion. Neck supple. Carotid bruit is not present. No thyromegaly present.  Cardiovascular: Normal rate, regular rhythm, normal heart sounds and intact distal pulses.  Exam reveals no gallop and no friction rub.   No murmur heard. Pulmonary/Chest: Effort normal and breath sounds normal. She has no wheezes. She has no rales.  Abdominal: Soft. Bowel sounds are normal. She exhibits no distension and no mass. There is no tenderness. There is no rebound and no guarding.  Lymphadenopathy:    She has no cervical adenopathy.  Neurological: She is alert and oriented to person, place, and time. No cranial nerve deficit.  Skin: Skin is warm and dry. No rash noted. She is not diaphoretic. No erythema. No pallor.  Psychiatric: She has a normal mood and affect. Her behavior is normal.        Assessment & Plan:   1. Essential hypertension, benign   2. Rosacea   3.  Restless leg syndrome   4. Other seasonal allergic rhinitis   5. Hyperlipidemia   6. Fibromyalgia   7. Asthma, unspecified asthma severity, uncomplicated   8. Glucose intolerance (impaired glucose tolerance)   9. Need for shingles vaccine   10. Multiple allergies    -stable conditions. -refills provided. -obtain labs.   Orders Placed This Encounter  Procedures  . CBC with Differential/Platelet  . Comprehensive metabolic panel    Order Specific Question:  Has the patient fasted?    Answer:  Yes  . Lipid panel    Order  Specific Question:  Has the patient fasted?    Answer:  Yes  . Hemoglobin A1c   Meds ordered this encounter  Medications  . zoster vaccine live, PF, (ZOSTAVAX) 16109 UNT/0.65ML injection    Sig: Inject 19,400 Units into the skin once.    Dispense:  1 each    Refill:  0  . hydrochlorothiazide (HYDRODIURIL) 12.5 MG tablet    Sig: Take 1-2 tablets (12.5-25 mg total) by mouth daily.    Dispense:  60 tablet    Refill:  5  . cetirizine (ZYRTEC) 10 MG tablet    Sig: Take 1 tablet (10 mg total) by mouth daily.    Dispense:  30 tablet    Refill:  11  . cyclobenzaprine (FLEXERIL) 5 MG tablet    Sig: Take 1 tablet (5 mg total) by mouth at bedtime.    Dispense:  30 tablet    Refill:  3  . doxycycline (VIBRA-TABS) 100 MG tablet    Sig: Take 1 tablet (100 mg total) by mouth 2 (two) times daily.    Dispense:  60 tablet    Refill:  1  . atorvastatin (LIPITOR) 10 MG tablet    Sig: Take 1 tablet (10 mg total) by mouth daily.    Dispense:  30 tablet    Refill:  5    Return in about 6 months (around 10/10/2015) for complete physical examiniation.    Leea Rambeau Paulita Fujita, M.D. [facility] [address] [place], [place]  [address] [phone] phone [phone] fax

## 2015-04-13 LAB — HEMOGLOBIN A1C
HEMOGLOBIN A1C: 5.9 % — AB (ref <5.7)
MEAN PLASMA GLUCOSE: 123 mg/dL — AB (ref <117)

## 2015-04-14 ENCOUNTER — Encounter: Payer: Self-pay | Admitting: Family Medicine

## 2015-04-14 MED ORDER — ATORVASTATIN CALCIUM 10 MG PO TABS: 10.0000 mg | ORAL_TABLET | Freq: Every day | ORAL | Status: DC

## 2015-04-20 ENCOUNTER — Encounter: Payer: Self-pay | Admitting: *Deleted

## 2015-05-12 IMAGING — US ABDOMEN ULTRASOUND LIMITED
1 series · 14 of 25 positions shown · non-contrast
Comparison: none

REASON FOR EXAM: abdominal pain with shoulder pain
COMMENTS:   Body Site: GB and Fossa, CBD, Head of Pancreas

[Series 1: abdomen ultrasound limited · 0.30mm/px · 14 of 39 slices shown]
[im 1/39]
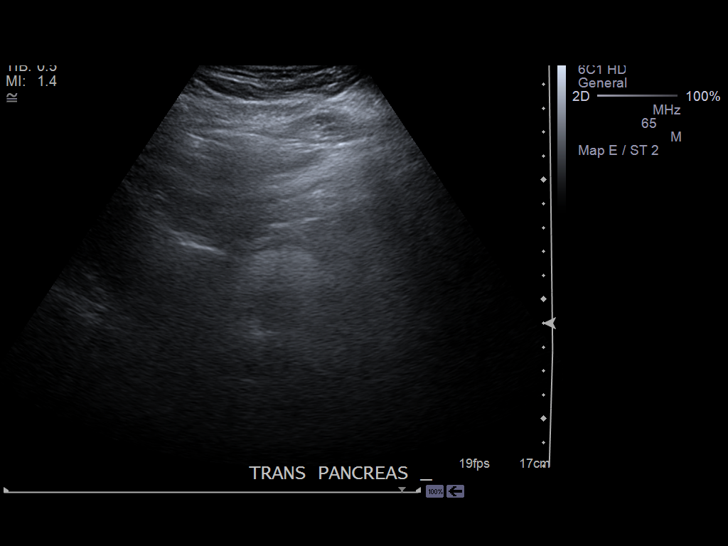
[im 4/39]
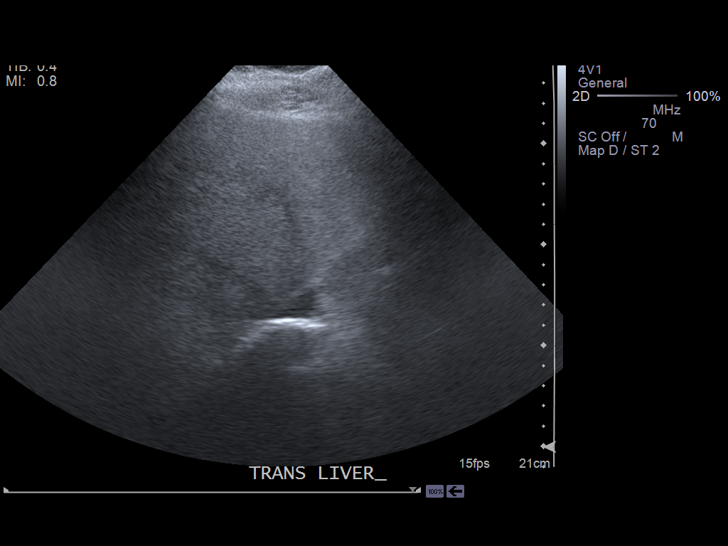
[im 7/39]
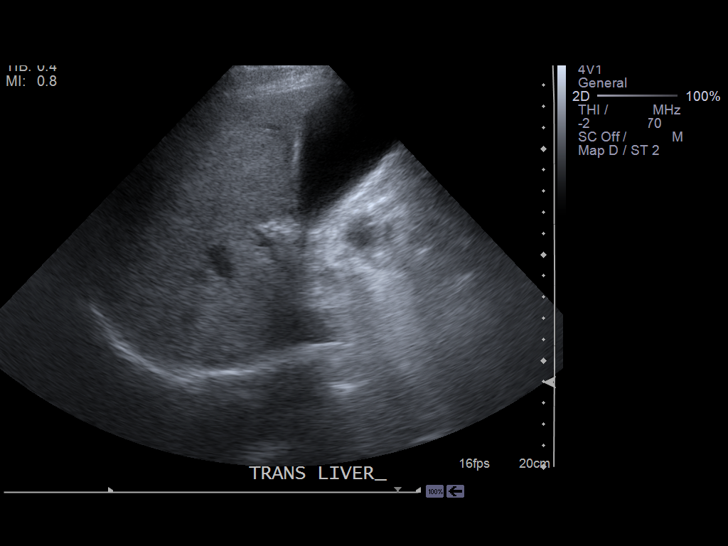
[im 10/39]
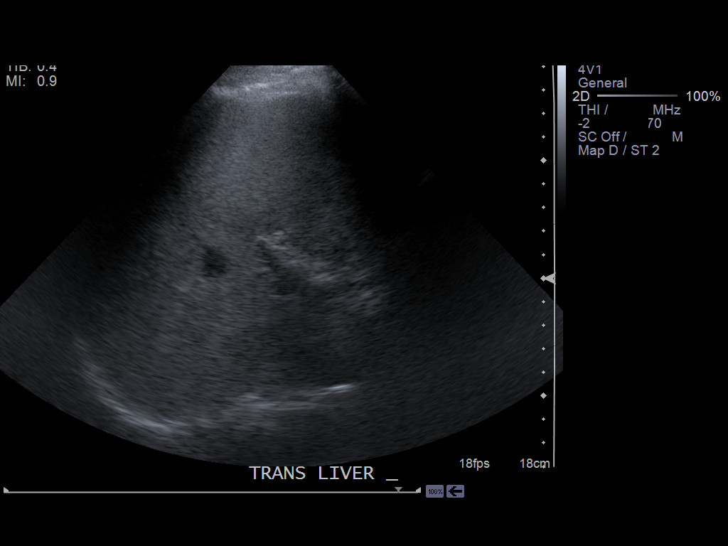
[im 13/39]
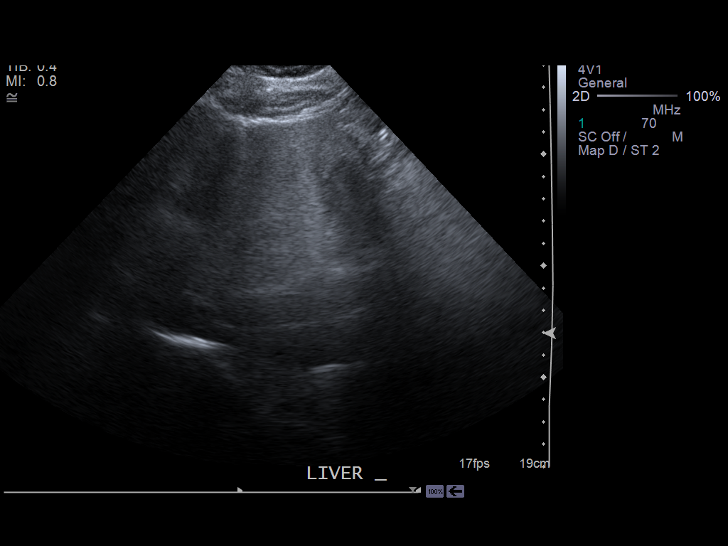
[im 15/39]
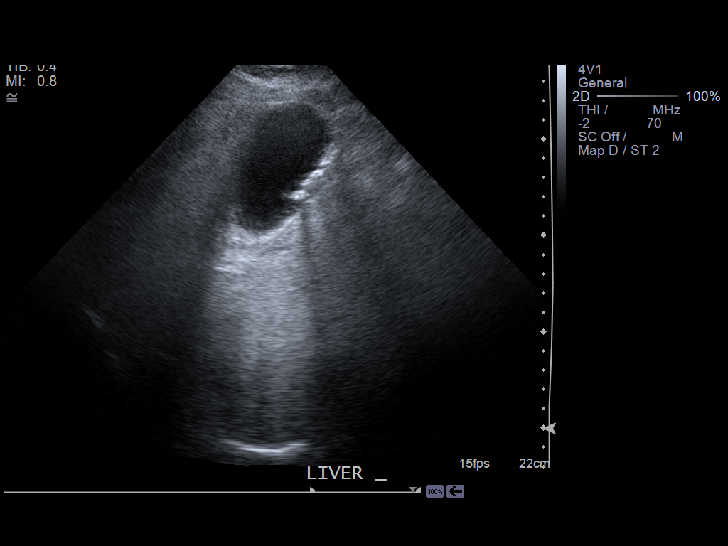
[im 18/39]
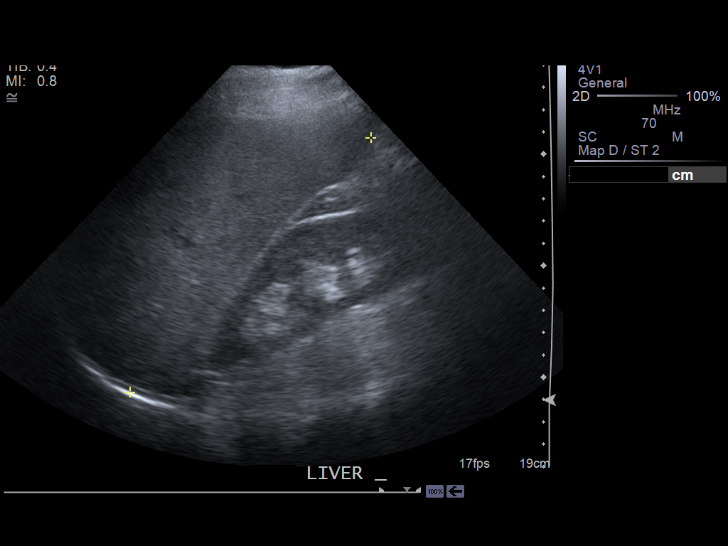
[im 21/39]
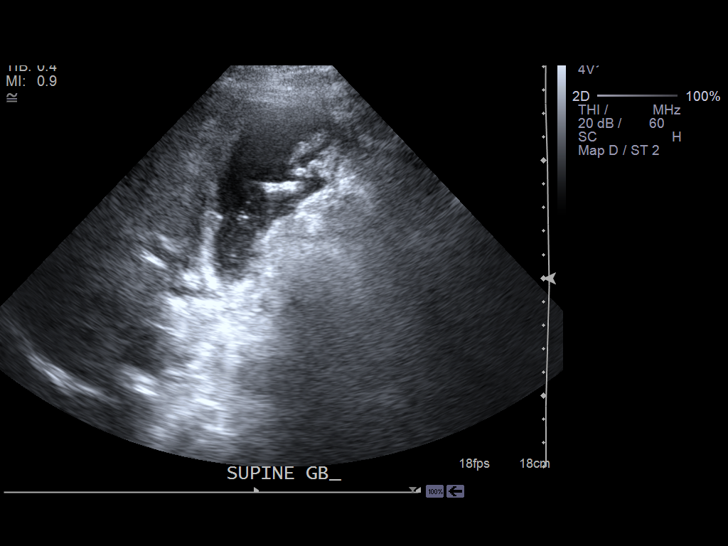
[im 24/39]
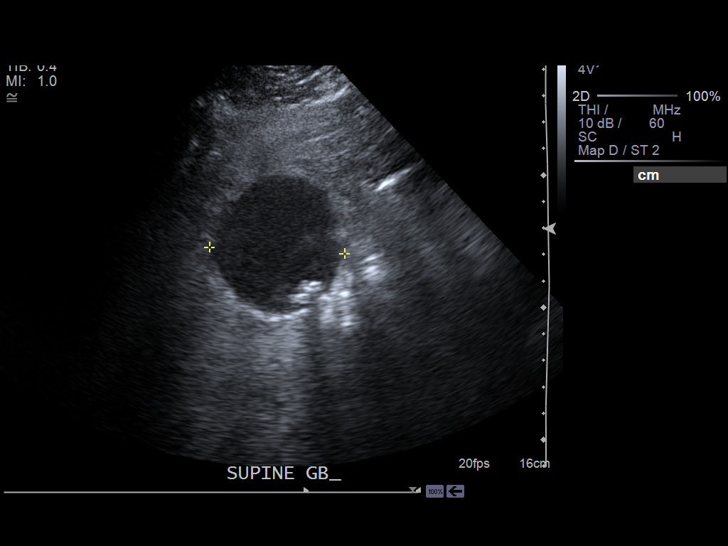
[im 26/39]
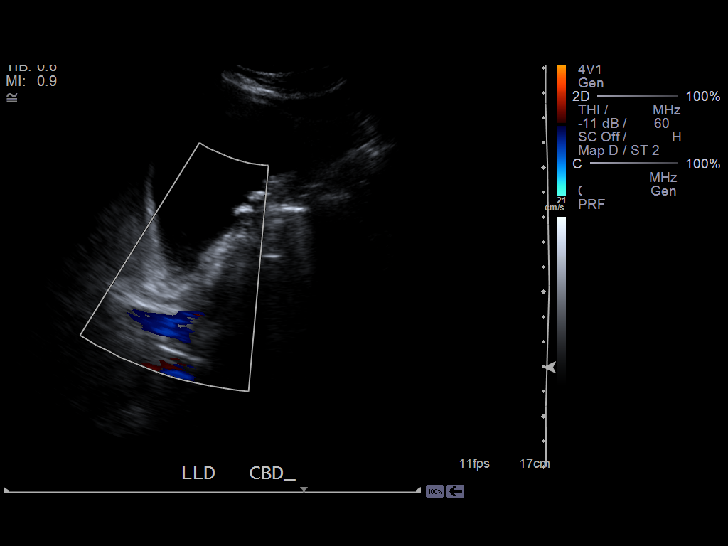
[im 29/39]
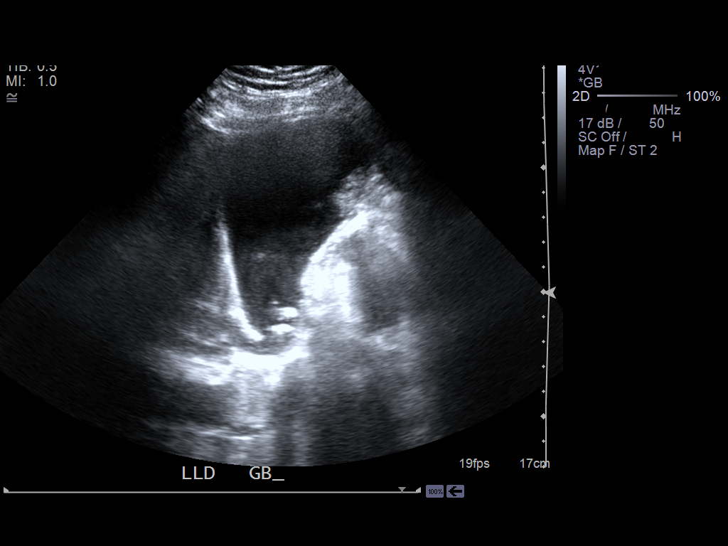
[im 32/39]
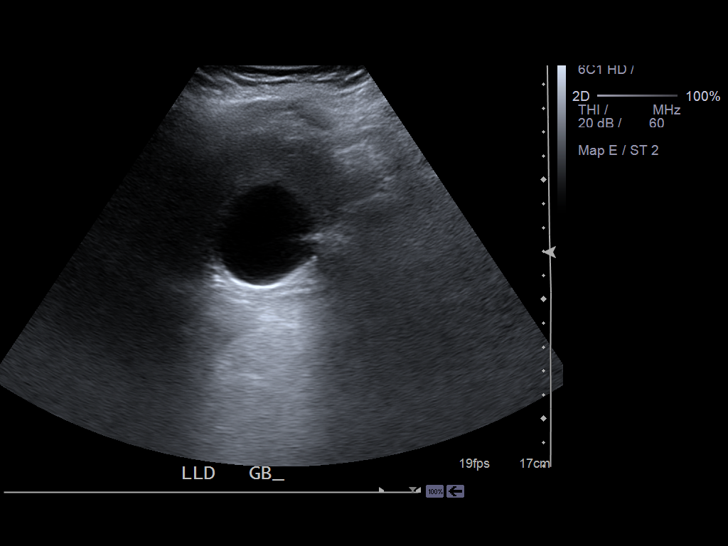
[im 35/39]
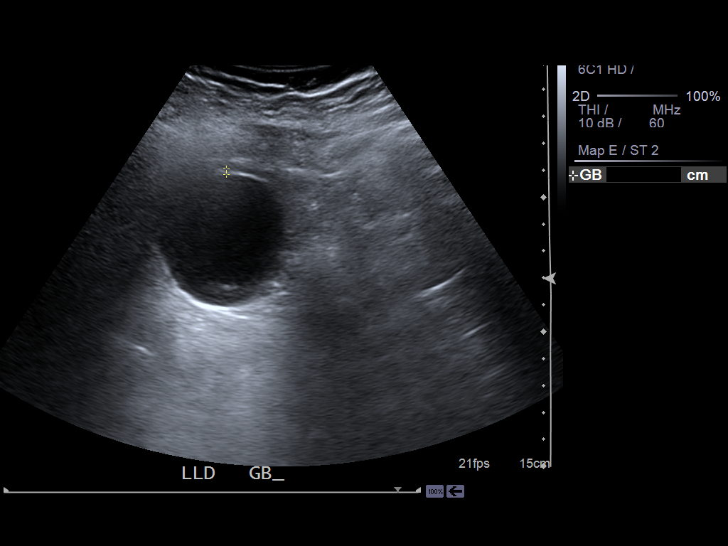
[im 39/39]
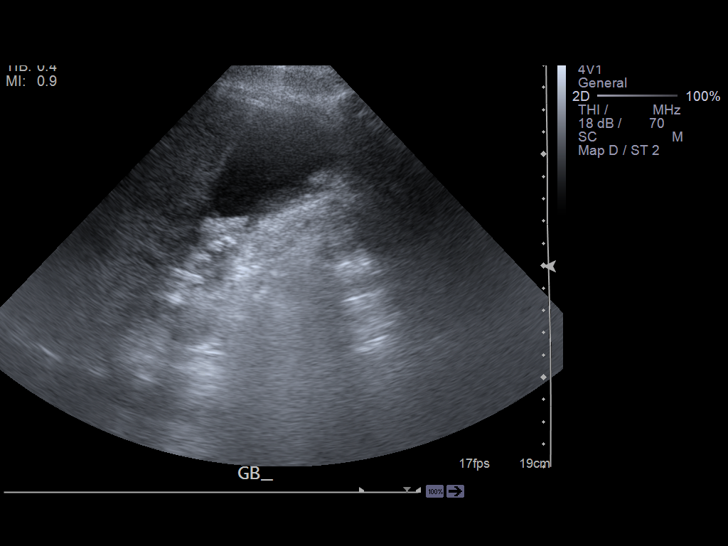

[14 of 25 positions shown; findings below may reference images not displayed]

PROCEDURE:     US  - US ABDOMEN LIMITED SURVEY  - August 03, 2012 [DATE]

RESULT:     Limited right upper quadrant abdominal sonogram is performed.
The visualized pancreas appears grossly normal. The hepatic echotexture is
increased consistent with fatty infiltration. There is no discrete mass or
intrahepatic biliary ductal dilation. The gallbladder contains multiple
stones that appear to be mobile with changes in patient position. Sludge is
also demonstrated. The liver length is 15.72 cm. Gallbladder wall thickening
is 2.1 mm. Portal venous flow is normal. The common bile duct diameter is
3.8 mm.
IMPRESSION: 1. Cholelithiasis without sonographic evidence of acute cholecystitis.
2. Fatty infiltration of the liver.

[REDACTED]

## 2015-05-17 ENCOUNTER — Other Ambulatory Visit: Payer: Self-pay | Admitting: Physician Assistant

## 2015-07-27 ENCOUNTER — Other Ambulatory Visit: Payer: Self-pay | Admitting: Family Medicine

## 2015-08-03 ENCOUNTER — Other Ambulatory Visit: Payer: Self-pay | Admitting: Family Medicine

## 2016-02-09 ENCOUNTER — Other Ambulatory Visit: Payer: Self-pay | Admitting: Family Medicine

## 2016-03-17 ENCOUNTER — Other Ambulatory Visit: Payer: Self-pay | Admitting: Family Medicine

## 2016-06-05 ENCOUNTER — Encounter: Payer: Self-pay | Admitting: Family Medicine

## 2016-06-05 ENCOUNTER — Ambulatory Visit (INDEPENDENT_AMBULATORY_CARE_PROVIDER_SITE_OTHER): Payer: 59 | Admitting: Family Medicine

## 2016-06-05 VITALS — BP 148/84 | HR 88 | Temp 97.1°F | Resp 18 | Ht 66.0 in | Wt 213.0 lb

## 2016-06-05 DIAGNOSIS — R7302 Impaired glucose tolerance (oral): Secondary | ICD-10-CM

## 2016-06-05 DIAGNOSIS — L719 Rosacea, unspecified: Secondary | ICD-10-CM | POA: Diagnosis not present

## 2016-06-05 DIAGNOSIS — M797 Fibromyalgia: Secondary | ICD-10-CM | POA: Diagnosis not present

## 2016-06-05 DIAGNOSIS — Z889 Allergy status to unspecified drugs, medicaments and biological substances status: Secondary | ICD-10-CM | POA: Diagnosis not present

## 2016-06-05 DIAGNOSIS — G2581 Restless legs syndrome: Secondary | ICD-10-CM

## 2016-06-05 DIAGNOSIS — J302 Other seasonal allergic rhinitis: Secondary | ICD-10-CM

## 2016-06-05 DIAGNOSIS — I10 Essential (primary) hypertension: Secondary | ICD-10-CM

## 2016-06-05 DIAGNOSIS — E7800 Pure hypercholesterolemia, unspecified: Secondary | ICD-10-CM | POA: Diagnosis not present

## 2016-06-05 DIAGNOSIS — R7989 Other specified abnormal findings of blood chemistry: Secondary | ICD-10-CM

## 2016-06-05 DIAGNOSIS — J4520 Mild intermittent asthma, uncomplicated: Secondary | ICD-10-CM

## 2016-06-05 DIAGNOSIS — R946 Abnormal results of thyroid function studies: Secondary | ICD-10-CM

## 2016-06-05 LAB — POCT URINALYSIS DIP (MANUAL ENTRY)
BILIRUBIN UA: NEGATIVE
Bilirubin, UA: NEGATIVE
Glucose, UA: NEGATIVE
NITRITE UA: NEGATIVE
PROTEIN UA: NEGATIVE
Spec Grav, UA: 1.005 (ref 1.030–1.035)
Urobilinogen, UA: 0.2 (ref ?–2.0)
pH, UA: 6 (ref 5.0–8.0)

## 2016-06-05 MED ORDER — CETIRIZINE HCL 10 MG PO TABS: 10.0000 mg | ORAL_TABLET | Freq: Every day | ORAL | 11 refills | Status: DC

## 2016-06-05 MED ORDER — HYDROCHLOROTHIAZIDE 12.5 MG PO TABS: 12.5000 mg | ORAL_TABLET | Freq: Every day | ORAL | 5 refills | Status: DC

## 2016-06-05 MED ORDER — CYCLOBENZAPRINE HCL 5 MG PO TABS: 5.0000 mg | ORAL_TABLET | Freq: Every day | ORAL | 3 refills | Status: DC

## 2016-06-05 MED ORDER — DOXYCYCLINE HYCLATE 100 MG PO TABS: ORAL_TABLET | ORAL | 0 refills | Status: DC

## 2016-06-05 NOTE — Patient Instructions (Addendum)
IF you received an x-ray today, you will receive an invoice from [facility]. Please contact [facility] at [phone] with questions or concerns regarding your invoice.   IF you received labwork today, you will receive an invoice from [facility]. Please contact [facility] at [phone] with questions or concerns regarding your invoice.   Our billing staff will not be able to assist you with questions regarding bills from these companies.  You will be contacted with the lab results as soon as they are available. The fastest way to get your results is to activate your My Chart account. Instructions are located on the last page of this paperwork. If you have not heard from [place] regarding the results in 2 weeks, please contact this office.     medit Fat and Cholesterol Restricted Diet Getting too much fat and cholesterol in your diet may cause health problems. Following this diet helps keep your fat and cholesterol at normal levels. This can keep you from getting sick. What types of fat should I choose?  Choose monosaturated and polyunsaturated fats. These are found in foods such as olive oil, canola oil, flaxseeds, walnuts, almonds, and seeds.  Eat more omega-3 fats. Good choices include salmon, mackerel, sardines, tuna, flaxseed oil, and ground flaxseeds.  Limit saturated fats. These are in animal products such as meats, butter, and cream. They can also be in plant products such as palm oil, palm kernel oil, and coconut oil.  Avoid foods with partially hydrogenated oils in them. These contain trans fats. Examples of foods that have trans fats are stick margarine, some tub margarines, cookies, crackers, and other baked goods. What general guidelines do I need to follow?  Check food labels. Look for the words "trans fat" and "saturated fat."  When preparing a meal:  Fill half of your plate with vegetables and green salads.  Fill one fourth of your plate with whole  grains. Look for the word "whole" as the first word in the ingredient list.  Fill one fourth of your plate with lean protein foods.  Eat more foods that have fiber, like apples, carrots, beans, peas, and barley.  Eat more home-cooked foods. Eat less at restaurants and buffets.  Limit or avoid alcohol.  Limit foods high in starch and sugar.  Limit fried foods.  [place] foods without frying them. Baking, boiling, grilling, and broiling are all great options.  Lose weight if you are overweight. Losing even a small amount of weight can help your overall health. It can also help prevent diseases such as diabetes and heart disease. What foods can I eat? Grains  Whole grains, such as whole wheat or whole grain breads, crackers, cereals, and pasta. Unsweetened oatmeal, bulgur, barley, quinoa, or brown rice. Corn or whole wheat flour tortillas. Vegetables  Fresh or frozen vegetables (raw, steamed, roasted, or grilled). Green salads. Fruits  All fresh, canned (in natural juice), or frozen fruits. Meat and Other Protein Products  Ground beef (85% or leaner), grass-fed beef, or beef trimmed of fat. Skinless chicken or [place]. Ground chicken or [place]. Pork trimmed of fat. All fish and seafood. Eggs. Dried beans, peas, or lentils. Unsalted nuts or seeds. Unsalted canned or dry beans. Dairy  Low-fat dairy products, such as skim or 1% milk, 2% or reduced-fat cheeses, low-fat ricotta or cottage cheese, or plain low-fat yogurt. Fats and Oils  Tub margarines without trans fats. Light or reduced-fat mayonnaise and salad dressings. Avocado. Olive, canola, sesame, or safflower oils. Natural peanut  or almond butter (choose ones without added sugar and oil). The items listed above may not be a complete list of recommended foods or beverages. Contact your dietitian for more options.  What foods are not recommended? Grains  White bread. White pasta. White rice. Cornbread. Bagels, pastries, and croissants.  Crackers that contain trans fat. Vegetables  White potatoes. Corn. Creamed or fried vegetables. Vegetables in a cheese sauce. Fruits  Dried fruits. Canned fruit in light or heavy syrup. Fruit juice. Meat and Other Protein Products  Fatty cuts of meat. Ribs, chicken wings, bacon, sausage, bologna, salami, chitterlings, fatback, hot dogs, bratwurst, and packaged luncheon meats. Liver and organ meats. Dairy  Whole or 2% milk, cream, half-and-half, and cream cheese. Whole milk cheeses. Whole-fat or sweetened yogurt. Full-fat cheeses. Nondairy creamers and whipped toppings. Processed cheese, cheese spreads, or cheese curds. Sweets and Desserts  Corn syrup, sugars, honey, and molasses. Candy. Jam and jelly. Syrup. Sweetened cereals. Cookies, pies, cakes, donuts, muffins, and ice cream. Fats and Oils  Butter, stick margarine, lard, shortening, ghee, or bacon fat. Coconut, palm kernel, or palm oils. Beverages  Alcohol. Sweetened drinks (such as sodas, lemonade, and fruit drinks or punches). The items listed above may not be a complete list of foods and beverages to avoid. Contact your dietitian for more information.  This information is not intended to replace advice given to you by your health care provider. Make sure you discuss any questions you have with your health care provider. Document Released: 08/21/2011 Document Revised: 10/27/2015 Document Reviewed: 05/21/2013 Elsevier Interactive Patient Education  2017 Elsevier Inc.  

## 2016-06-05 NOTE — Progress Notes (Signed)
Subjective:    Patient ID: Mindy Hood, female    DOB: 24-Sep-1950, 66 y.o.   MRN: [id number]  06/05/2016  Follow-up (med check)   HPI This 66 y.o. female presents for one year follow-up of hypertension, hypercholesterolemia, glucose intolerance, anxiety, fibromyalgia.  Recommended statin therapy at last visit/Atorvastatin  daily.   Has done really well in the past year.  Would not come in during flu season.  Work is good.  Last visit, preparing to go to [facility].  God works in mysterious ways.  Only there three weeks.  Learned so much.  Husband is bipolar, alcoholism, and diabetic.  Took three days to adjust to weather, atmosphere.  After three days, able to relax.   When returned, had to adjust to original life.  Then husband stopped bipolar disorder.  Convinced that husband has Asperger's syndrome. Works from home; stuck there all the time.   Cannot leave husband; he has no one other than patient.  Not checking BP at home.    Immunization History  Administered Date(s) Administered  . Tdap 06/17/2009   BP Readings from Last 3 Encounters:  06/05/16 (!) 148/84  04/12/15 122/84  12/06/14 124/90   Wt Readings from Last 3 Encounters:  06/05/16 213 lb (96.6 kg)  04/12/15 215 lb (97.5 kg)  12/06/14 219 lb (99.3 kg)    Review of Systems  Constitutional: Negative for chills, diaphoresis, fatigue and fever.  Eyes: Negative for visual disturbance.  Respiratory: Negative for cough and shortness of breath.   Cardiovascular: Negative for chest pain, palpitations and leg swelling.  Gastrointestinal: Negative for abdominal pain, constipation, diarrhea, nausea and vomiting.  Endocrine: Negative for cold intolerance, heat intolerance, polydipsia, polyphagia and polyuria.  Neurological: Negative for dizziness, tremors, seizures, syncope, facial asymmetry, speech difficulty, weakness, light-headedness, numbness and headaches.    Past Medical History:  Diagnosis Date  . Allergy   . Asthma   .  Fibromyalgia 2002   treated for Lyme's disease in 2001.  Marland Kitchen Hyperlipidemia   . Hypertension   . IBS (irritable bowel syndrome)    diarrhea predominant  . Nephrolithiasis   . Restless leg syndrome    Cyclobenzaprine PRN.  Marland Kitchen Rosacea   . Urinary, incontinence, stress female    Past Surgical History:  Procedure Laterality Date  . ABDOMINAL HYSTERECTOMY  03/06/1975   ovaries intact.  Vaginal/uterine mass benign with hemorrhage.  Marland Kitchen CARPAL TUNNEL RELEASE  2006   right hand  . CHOLECYSTECTOMY    . NECK SURGERY  1993   2 disc surgery   Allergies  Allergen Reactions  . Codeine Itching  . Latex Rash  . Penicillins Rash  . Phenergan [Promethazine Hcl] Rash    Social History   Social History  . Marital status: Married    Spouse name: N/A  . Number of children: N/A  . Years of education: N/A   Occupational History  . Not on file.   Social History Main Topics  . Smoking status: Never Smoker  . Smokeless tobacco: Never Used  . Alcohol use 0.0 oz/week     Comment: rarely  . Drug use: No  . Sexual activity: Not on file   Other Topics Concern  . Not on file   Social History Narrative   Marital status: married x  16 years; second marriage; husband is bipolar and alcoholic.         Children: 1 son (56); 2 granddaughters but has no relationship with them.      Lives:  with husband      Employment:  Works from home full time. Works for [employer]; enters Radiation protection practitioner; desk job.      Tobacco: none      Alcohol: none      Exercise: none; no exercise since 2015.      Seatbelt: 100%      Guns: none      Family History  Problem Relation Age of Onset  . Diverticulitis Sister   . Hyperlipidemia Sister   . Hypertension Sister   . Fibromyalgia Sister   . Hypertension Sister   . Hyperlipidemia Sister   . Hypertension Mother   . Hyperlipidemia Mother   . Alcohol abuse Mother   . Cancer Mother     tongue/oral, throat, bladder cancer with mets  . Alcohol abuse Father   . Heart  disease Father 30    AMI s/p CABG  . Cirrhosis Father        Objective:    BP (!) 148/84   Pulse 88   Temp 97.1 F (36.2 C) (Oral)   Resp 18   Ht  (1.676 m)   Wt 213 lb (96.6 kg)   SpO2 98%   BMI 34.38 kg/m  Physical Exam  Constitutional: She is oriented to person, place, and time. She appears well-developed and well-nourished. No distress.  HENT:  Head: Normocephalic and atraumatic.  Right Ear: External ear normal.  Left Ear: External ear normal.  Nose: Nose normal.  Mouth/Throat: Oropharynx is clear and moist.  Eyes: Conjunctivae and EOM are normal. Pupils are equal, round, and reactive to light.  Neck: Normal range of motion. Neck supple. Carotid bruit is not present. No thyromegaly present.  Cardiovascular: Normal rate, regular rhythm, normal heart sounds and intact distal pulses.  Exam reveals no gallop and no friction rub.   No murmur heard. Pulmonary/Chest: Effort normal and breath sounds normal. She has no wheezes. She has no rales.  Abdominal: Soft. Bowel sounds are normal. She exhibits no distension and no mass. There is no tenderness. There is no rebound and no guarding.  Lymphadenopathy:    She has no cervical adenopathy.  Neurological: She is alert and oriented to person, place, and time. No cranial nerve deficit.  Skin: Skin is warm and dry. No rash noted. She is not diaphoretic. No erythema. No pallor.  Psychiatric: She has a normal mood and affect. Her behavior is normal.   Depression screen [facility] 2/9 06/05/2016 06/05/2016 04/12/2015 12/06/2014 10/06/2014  Decreased Interest 0 0 0 0 0  Down, Depressed, Hopeless 0 0 0 0 0  PHQ - 2 Score 0 0 0 0 0   Fall Risk  06/05/2016 06/05/2016 04/12/2015 12/06/2014 08/04/2014  Falls in the past year? No No No No No        Assessment & Plan:   1. Essential hypertension, benign   2. Pure hypercholesterolemia   3. Glucose intolerance (impaired glucose tolerance)   4. Other seasonal allergic rhinitis   5. Intermittent asthma  without complication, unspecified asthma severity   6. Rosacea   7. Fibromyalgia   8. Restless leg syndrome   9. Multiple allergies    -moderately controlled hypertension; recommend checking BP daily at home; call if BP remains > 140/90.  Obtain labs; refills provided. -refill of Zyrtec provided for allergic rhinitis. -rx for Flexeril provided to take qhs for TMJ. -refill of Doxycycline provided due to multiple tick exposures. -did not start statin therapy after last visit.  Repeat labs today.  Orders Placed This Encounter  Procedures  . CBC with Differential/Platelet  . Comprehensive metabolic panel    Order Specific Question:   Has the patient fasted?    Answer:   Yes  . Hemoglobin A1c  . Lipid panel    Order Specific Question:   Has the patient fasted?    Answer:   Yes  . TSH  . POCT urinalysis dipstick   Meds ordered this encounter  Medications  . cetirizine (ZYRTEC) 10 MG tablet    Sig: Take 1 tablet (10 mg total) by mouth daily.    Dispense:  30 tablet    Refill:  11  . cyclobenzaprine (FLEXERIL) 5 MG tablet    Sig: Take 1 tablet (5 mg total) by mouth at bedtime.    Dispense:  30 tablet    Refill:  3  . doxycycline (VIBRA-TABS) 100 MG tablet    Sig: TAKE 1 TABLET (100 MG TOTAL) BY MOUTH 2 (TWO) TIMES DAILY.    Dispense:  60 tablet    Refill:  0  . hydrochlorothiazide (HYDRODIURIL) 12.5 MG tablet    Sig: Take 1-2 tablets (12.5-25 mg total) by mouth daily.    Dispense:  60 tablet    Refill:  5    Return in about 6 months (around 12/05/2016) for complete physical examiniation.   Deanna Wiater Paulita Fujita, M.D. Primary Care at [facility] previously [facility] [address] [place], [place]  [address] [phone] phone [phone] fax

## 2016-06-06 LAB — COMPREHENSIVE METABOLIC PANEL
ALBUMIN: 4 g/dL (ref 3.6–4.8)
ALT: 27 IU/L (ref 0–32)
AST: 23 IU/L (ref 0–40)
Albumin/Globulin Ratio: 1.4 (ref 1.2–2.2)
Alkaline Phosphatase: 55 IU/L (ref 39–117)
BUN / CREAT RATIO: 17 (ref 12–28)
BUN: 13 mg/dL (ref 8–27)
Bilirubin Total: 0.3 mg/dL (ref 0.0–1.2)
CALCIUM: 9.5 mg/dL (ref 8.7–10.3)
CO2: 27 mmol/L (ref 18–29)
CREATININE: 0.76 mg/dL (ref 0.57–1.00)
Chloride: 101 mmol/L (ref 96–106)
GFR calc Af Amer: 96 mL/min/{1.73_m2} (ref >59)
GFR, EST NON AFRICAN AMERICAN: 83 mL/min/{1.73_m2} (ref >59)
GLOBULIN, TOTAL: 2.9 g/dL (ref 1.5–4.5)
Glucose: 94 mg/dL (ref 65–99)
Potassium: 4.5 mmol/L (ref 3.5–5.2)
SODIUM: 144 mmol/L (ref 134–144)
Total Protein: 6.9 g/dL (ref 6.0–8.5)

## 2016-06-06 LAB — HEMOGLOBIN A1C
Est. average glucose Bld gHb Est-mCnc: 111 mg/dL
Hgb A1c MFr Bld: 5.5 % (ref 4.8–5.6)

## 2016-06-06 LAB — LIPID PANEL
CHOL/HDL RATIO: 4.9 ratio — AB (ref 0.0–4.4)
Cholesterol, Total: 229 mg/dL — ABNORMAL HIGH (ref 100–199)
HDL: 47 mg/dL (ref >39)
LDL Calculated: 142 mg/dL — ABNORMAL HIGH (ref 0–99)
Triglycerides: 200 mg/dL — ABNORMAL HIGH (ref 0–149)
VLDL CHOLESTEROL CAL: 40 mg/dL (ref 5–40)

## 2016-06-06 LAB — CBC WITH DIFFERENTIAL/PLATELET
Basophils Absolute: 0 10*3/uL (ref 0.0–0.2)
Basos: 0 %
EOS (ABSOLUTE): 0.1 10*3/uL (ref 0.0–0.4)
EOS: 2 %
HEMATOCRIT: 42.5 % (ref 34.0–46.6)
HEMOGLOBIN: 14.1 g/dL (ref 11.1–15.9)
IMMATURE GRANULOCYTES: 0 %
Immature Grans (Abs): 0 10*3/uL (ref 0.0–0.1)
LYMPHS ABS: 2.3 10*3/uL (ref 0.7–3.1)
Lymphs: 38 %
MCH: 30.9 pg (ref 26.6–33.0)
MCHC: 33.2 g/dL (ref 31.5–35.7)
MCV: 93 fL (ref 79–97)
MONOCYTES: 12 %
Monocytes Absolute: 0.7 10*3/uL (ref 0.1–0.9)
Neutrophils Absolute: 2.8 10*3/uL (ref 1.4–7.0)
Neutrophils: 48 %
Platelets: 249 10*3/uL (ref 150–379)
RBC: 4.56 x10E6/uL (ref 3.77–5.28)
RDW: 13.4 % (ref 12.3–15.4)
WBC: 6 10*3/uL (ref 3.4–10.8)

## 2016-06-06 LAB — TSH

## 2016-06-11 LAB — T4, FREE: FREE T4: 1.62 ng/dL (ref 0.82–1.77)

## 2016-06-11 LAB — SPECIMEN STATUS REPORT

## 2016-11-21 ENCOUNTER — Telehealth: Payer: Self-pay | Admitting: Family Medicine

## 2016-11-21 NOTE — Telephone Encounter (Signed)
Pt came in stating that she needs a note stating that she has lower back issues her insurance is refusing to pay for her suv without proof that she has lower back problems..  Please advise: [phone]

## 2016-11-27 ENCOUNTER — Other Ambulatory Visit: Payer: Self-pay | Admitting: Family Medicine

## 2016-12-03 ENCOUNTER — Encounter: Payer: Self-pay | Admitting: Family Medicine

## 2016-12-03 ENCOUNTER — Ambulatory Visit (INDEPENDENT_AMBULATORY_CARE_PROVIDER_SITE_OTHER): Payer: 59 | Admitting: Family Medicine

## 2016-12-03 ENCOUNTER — Ambulatory Visit (INDEPENDENT_AMBULATORY_CARE_PROVIDER_SITE_OTHER): Payer: 59

## 2016-12-03 VITALS — BP 134/80 | HR 107 | Temp 98.4°F | Resp 18 | Ht 66.0 in | Wt 212.0 lb

## 2016-12-03 DIAGNOSIS — R7302 Impaired glucose tolerance (oral): Secondary | ICD-10-CM | POA: Diagnosis not present

## 2016-12-03 DIAGNOSIS — R946 Abnormal results of thyroid function studies: Secondary | ICD-10-CM

## 2016-12-03 DIAGNOSIS — J302 Other seasonal allergic rhinitis: Secondary | ICD-10-CM | POA: Diagnosis not present

## 2016-12-03 DIAGNOSIS — J4520 Mild intermittent asthma, uncomplicated: Secondary | ICD-10-CM | POA: Diagnosis not present

## 2016-12-03 DIAGNOSIS — M545 Low back pain, unspecified: Secondary | ICD-10-CM

## 2016-12-03 DIAGNOSIS — G2581 Restless legs syndrome: Secondary | ICD-10-CM

## 2016-12-03 DIAGNOSIS — E7800 Pure hypercholesterolemia, unspecified: Secondary | ICD-10-CM

## 2016-12-03 DIAGNOSIS — Z6834 Body mass index (BMI) 34.0-34.9, adult: Secondary | ICD-10-CM

## 2016-12-03 DIAGNOSIS — M797 Fibromyalgia: Secondary | ICD-10-CM

## 2016-12-03 DIAGNOSIS — I10 Essential (primary) hypertension: Secondary | ICD-10-CM

## 2016-12-03 DIAGNOSIS — E6609 Other obesity due to excess calories: Secondary | ICD-10-CM

## 2016-12-03 NOTE — Progress Notes (Signed)
Subjective:    Patient ID: Mindy Hood, female    DOB: 1951-01-24, 66 y.o.   MRN: [id number]  12/03/2016  Medication Refill (Hydrochlorothiazide 12.5 MG, Cetirizine 10 MG, Doxycyline Hyclate 100 MG, Flexeril 5 MG); Paperwork (Pt states she brings in paperwork for work because of her back. Pt states she needs a upgraded car like a SUV and not a low car.); Hypertension; and Allergic Rhinitis    HPI This 66 y.o. female presents for evaluation of hypertension, fibromyalgia, hypercholesterolemia.  TSH <0.006.  Free T4 normal at last visit.  Management at visit six months ago included: -moderately controlled hypertension; recommend checking BP daily at home; call if BP remains > 140/90.  Obtain labs; refills provided. -refill of Zyrtec provided for allergic rhinitis. -rx for Flexeril provided to take qhs for TMJ. -refill of Doxycycline provided due to multiple tick exposures. -did not start statin therapy after last visit.  Repeat labs today.  Low back pain:  During recent work related trip, pt Upgrade to SUV for rental due to lower back pain.  Dr. Maureen Chatters PCP knows about chronic lower back pain.  Lower back will not let patient get out of lower car.  No orthopedist.  Had a neurologist for 2-3 years for lower back.  Has been a long time.  Bounced when hit spiral staircase.  Goes into spasms B.  Usually worse on one side than the other.  Will go out intermittently.  Could not straighten up back; really careful.  Damage to vertebra with fall in 1994.  Chronic issue with progressive worsening.  Back goes into spasms; has handicap placard from [place] from [place].  Yesterday was moving tubs and went out.  Last back xray years ago.  Work is going well.  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  All of a sudden, has three interviews within the company.  Would be more money/promotion.  Wants to work atleast 5-6 more years. Just returned from [facility].  Had to do digital  interview.    Three weeks ago, unable to get through; had to come up to office to schedule an appointment.  Came up to office on 11/22/16.  Does not want come into office in December/January/February.  Really careful.   Would love virtual visits.    Walking a lot.  Walking 3-4 hours.  Walking 1-2 hours with grocery shopping.  Lower back limits patient. Cannot sit very long.  Cannot sit for long. Does not watch television. No formal exercise.  Active.    REFUSES PREVNAR.  REFUSES INFLUENZA VACCINE. REFUSES SHINGRIX.  BP Readings from Last 3 Encounters:  12/03/16 134/80  06/05/16 (!) 148/84  04/12/15 122/84   Wt Readings from Last 3 Encounters:  12/03/16 212 lb (96.2 kg)  06/05/16 213 lb (96.6 kg)  04/12/15 215 lb (97.5 kg)   Immunization History  Administered Date(s) Administered  . Tdap 06/17/2009    Review of Systems  Constitutional: Negative for chills, diaphoresis, fatigue and fever.  Eyes: Negative for visual disturbance.  Respiratory: Negative for cough and shortness of breath.   Cardiovascular: Negative for chest pain, palpitations and leg swelling.  Gastrointestinal: Negative for abdominal pain, constipation, diarrhea, nausea and vomiting.  Endocrine: Negative for cold intolerance, heat intolerance, polydipsia, polyphagia and polyuria.  Musculoskeletal: Positive for back pain.  Neurological: Negative for dizziness, tremors, seizures, syncope, facial asymmetry, speech difficulty, weakness, light-headedness, numbness and headaches.    Past Medical History:  Diagnosis Date  .  Allergy   . Asthma   . Fibromyalgia 2002   treated for Lyme's disease in 2001.  Marland Kitchen Hyperlipidemia   . Hypertension   . IBS (irritable bowel syndrome)    diarrhea predominant  . Nephrolithiasis   . Restless leg syndrome    Cyclobenzaprine PRN.  Marland Kitchen Rosacea   . Urinary, incontinence, stress female    Past Surgical History:  Procedure Laterality Date  . ABDOMINAL HYSTERECTOMY  03/06/1975    ovaries intact.  Vaginal/uterine mass benign with hemorrhage.  Marland Kitchen CARPAL TUNNEL RELEASE  2006   right hand  . CHOLECYSTECTOMY    . NECK SURGERY  1993   2 disc surgery   Allergies  Allergen Reactions  . Codeine Itching  . Latex Rash  . Penicillins Rash  . Phenergan [Promethazine Hcl] Rash    Social History   Social History  . Marital status: Married    Spouse name: N/A  . Number of children: N/A  . Years of education: N/A   Occupational History  . Not on file.   Social History Main Topics  . Smoking status: Never Smoker  . Smokeless tobacco: Never Used  . Alcohol use 0.0 oz/week     Comment: rarely  . Drug use: No  . Sexual activity: Not on file   Other Topics Concern  . Not on file   Social History Narrative   Marital status: married x  16 years; second marriage; husband is bipolar and alcoholic.         Children: 1 son (24); 2 granddaughters but has no relationship with them.      Lives: with husband      Employment:  Works from home full time. Works for [employer]; enters Radiation protection practitioner; desk job.      Tobacco: none      Alcohol: none      Exercise: none; no exercise since 2015.      Seatbelt: 100%      Guns: none      Family History  Problem Relation Age of Onset  . Diverticulitis Sister   . Hyperlipidemia Sister   . Hypertension Sister   . Fibromyalgia Sister   . Hypertension Sister   . Hyperlipidemia Sister   . Hypertension Mother   . Hyperlipidemia Mother   . Alcohol abuse Mother   . Cancer Mother        tongue/oral, throat, bladder cancer with mets  . Alcohol abuse Father   . Heart disease Father 41       AMI s/p CABG  . Cirrhosis Father        Objective:    BP 134/80 (BP Location: Left Arm, Patient Position: Sitting, Cuff Size: Large)   Pulse (!) 107   Temp 98.4 F (36.9 C) (Oral)   Resp 18   Ht  (1.676 m)   Wt 212 lb (96.2 kg)   SpO2 98%   BMI 34.22 kg/m  Physical Exam  Constitutional: She is oriented to person, place, and  time. She appears well-developed and well-nourished. No distress.  HENT:  Head: Normocephalic and atraumatic.  Right Ear: External ear normal.  Left Ear: External ear normal.  Nose: Nose normal.  Mouth/Throat: Oropharynx is clear and moist.  Eyes: Pupils are equal, round, and reactive to light. Conjunctivae and EOM are normal.  Neck: Normal range of motion. Neck supple. Carotid bruit is not present. No thyromegaly present.  Cardiovascular: Normal rate, regular rhythm, normal heart sounds and intact distal pulses.  Exam reveals no gallop and no friction rub.   No murmur heard. Trace edema BLE.  Pulmonary/Chest: Effort normal and breath sounds normal. She has no wheezes. She has no rales.  Abdominal: Soft. Bowel sounds are normal. She exhibits no distension and no mass. There is no tenderness. There is no rebound and no guarding.  Musculoskeletal: She exhibits edema.       Lumbar back: She exhibits decreased range of motion and pain. She exhibits no tenderness, no bony tenderness, no spasm and normal pulse.  Lumbar spine:  Non-tender midline; non-tender paraspinal regions B.  Straight leg raises negative B; toe and heel walking intact; marching intact; motor 5/5 BLE.  Decreased ROM lumbar spine extension>flexion.   Lymphadenopathy:    She has no cervical adenopathy.  Neurological: She is alert and oriented to person, place, and time. No cranial nerve deficit.  Skin: Skin is warm and dry. No rash noted. She is not diaphoretic. No erythema. No pallor.  Psychiatric: She has a normal mood and affect. Her behavior is normal.    Dg Lumbar Spine Complete  Result Date: 12/03/2016 CLINICAL DATA:  Acute bilateral low back pain without sciatica. EXAM: LUMBAR SPINE - COMPLETE 4+ VIEW COMPARISON:  07/26/2013 FINDINGS: No evidence of acute fracture or traumatic malalignment. There is mild L2-3, L3-4, and L4-5 retrolisthesis, accentuated by rotation. Degenerative disc narrowing that is moderate at L2-3 and  L3-4. Mild facet spurring. No evidence of bone lesion or endplate erosion. Slight lumbar levocurvature. Atherosclerosis of the aorta. IMPRESSION: 1. No acute finding. 2. Moderate disc degeneration as described. Electronically Signed   By: Marnee Spring M.D.   On: 12/03/2016 10:40   Depression screen [facility] 2/9 12/03/2016 06/05/2016 06/05/2016 04/12/2015 12/06/2014  Decreased Interest 0 0 0 0 0  Down, Depressed, Hopeless 0 0 0 0 0  PHQ - 2 Score 0 0 0 0 0   Fall Risk  12/03/2016 06/05/2016 06/05/2016 04/12/2015 12/06/2014  Falls in the past year? No No No No No      Assessment & Plan:   1. Essential hypertension, benign   2. Intermittent asthma without complication, unspecified asthma severity   3. Other seasonal allergic rhinitis   4. Glucose intolerance (impaired glucose tolerance)   5. Fibromyalgia   6. Pure hypercholesterolemia   7. Restless leg syndrome   8. Thyroid function study abnormality   9. Acute bilateral low back pain without sciatica   10. Class 1 obesity due to excess calories with serious comorbidity and body mass index (BMI) of 34.0 to 34.9 in adult    -controlled hypertension; obtain labs; refill provided. -abnormal thyroid levels at last visit; repeat today with ab studies. -controlled allergic rhinitis and asthma; maintained on daily Zyrtec with good maintenance control. - I recommend weight loss, exercise, and low-carbohydrate low-sugar food choices. You should AVOID: regular sodas, sweetened tea, fruit juices.  You should LIMIT: breads, pastas, rice, potatoes, and desserts/sweets.  I would recommend limiting your total carbohydrate intake per meal to 45 grams; I would limit your total carbohydrate intake per snack to 30 grams.  I would also have a goal of 60 grams of protein intake per day; this would equal 10-15 grams of protein per meal and 5-10 grams of protein per snack. -refuses statin therapy due to fibromyalgia; repeat labs today. -patient reporting chronic lower back pain  with history of DDD lumbar spine; we have not discussed nor documented this chronic medical condition in the past; provider refused to complete employee paperwork  stating that patient warrants SUV with all work related trips; obtain LS spine films; HES provided; recommend Flexeril PRN.  Obtain old medical records yet will not complete paperwork.   Orders Placed This Encounter  Procedures  . DG Lumbar Spine Complete    Standing Status:   Future    Number of Occurrences:   1    Standing Expiration Date:   12/03/2017    Order Specific Question:   Reason for Exam (SYMPTOM  OR DIAGNOSIS REQUIRED)    Answer:   lower back pain    Order Specific Question:   Preferred imaging location?    Answer:   External  . CBC with Differential/Platelet  . Comprehensive metabolic panel    Order Specific Question:   Has the patient fasted?    Answer:   No  . Lipid panel    Order Specific Question:   Has the patient fasted?    Answer:   No  . T4, free  . TSH  . Thyroid Peroxidase Antibody  . Thyroid Stimulating Immunoglobulin   Meds ordered this encounter  Medications  . cyclobenzaprine (FLEXERIL) 5 MG tablet    Sig: Take 1 tablet (5 mg total) by mouth at bedtime.    Dispense:  30 tablet    Refill:  3  . doxycycline (VIBRA-TABS) 100 MG tablet    Sig: TAKE 1 TABLET (100 MG TOTAL) BY MOUTH 2 (TWO) TIMES DAILY.    Dispense:  60 tablet    Refill:  0  . hydrochlorothiazide (HYDRODIURIL) 12.5 MG tablet    Sig: Take 1-2 tablets (12.5-25 mg total) by mouth daily.    Dispense:  180 tablet    Refill:  1    Return in about 6 months (around 06/03/2017) for complete physical examiniation.   Kristi Paulita Fujita, M.D. Primary Care at [facility] previously [facility] [address] [place], [place]  [address] [phone] phone [phone] fax

## 2016-12-03 NOTE — Patient Instructions (Addendum)
IF you received an x-ray today, you will receive an invoice from [facility]. Please contact [facility] at [phone] with questions or concerns regarding your invoice.   IF you received labwork today, you will receive an invoice from [facility]. Please contact [facility] at [phone] with questions or concerns regarding your invoice.   Our billing staff will not be able to assist you with questions regarding bills from these companies.  You will be contacted with the lab results as soon as they are available. The fastest way to get your results is to activate your My Chart account. Instructions are located on the last page of this paperwork. If you have not heard from [place] regarding the results in 2 weeks, please contact this office.    Low b Low Back Sprain Rehab Ask your health care provider which exercises are safe for you. Do exercises exactly as told by your health care provider and adjust them as directed. It is normal to feel mild stretching, pulling, tightness, or discomfort as you do these exercises, but you should stop right away if you feel sudden pain or your pain gets worse. Do not begin these exercises until told by your health care provider. Stretching and range of motion exercises These exercises warm up your muscles and joints and improve the movement and flexibility of your back. These exercises also help to relieve pain, numbness, and tingling. Exercise A: Lumbar rotation  1. Lie on your back on a firm surface and bend your knees. 2. Straighten your arms out to your sides so each arm forms an "L" shape with a side of your body (a 90 degree angle). 3. Slowly move both of your knees to one side of your body until you feel a stretch in your lower back. Try not to let your shoulders move off of the floor. 4. Hold for __________ seconds. 5. Tense your abdominal muscles and slowly move your knees back to the starting position. 6. Repeat this exercise on  the other side of your body. Repeat __________ times. Complete this exercise __________ times a day. Exercise B: Prone extension on elbows  1. Lie on your abdomen on a firm surface. 2. Prop yourself up on your elbows. 3. Use your arms to help lift your chest up until you feel a gentle stretch in your abdomen and your lower back. ? This will place some of your body weight on your elbows. If this is uncomfortable, try stacking pillows under your chest. ? Your hips should stay down, against the surface that you are lying on. Keep your hip and back muscles relaxed. 4. Hold for __________ seconds. 5. Slowly relax your upper body and return to the starting position. Repeat __________ times. Complete this exercise __________ times a day. Strengthening exercises These exercises build strength and endurance in your back. Endurance is the ability to use your muscles for a long time, even after they get tired. Exercise C: Pelvic tilt 1. Lie on your back on a firm surface. Bend your knees and keep your feet flat. 2. Tense your abdominal muscles. Tip your pelvis up toward the ceiling and flatten your lower back into the floor. ? To help with this exercise, you may place a small towel under your lower back and try to push your back into the towel. 3. Hold for __________ seconds. 4. Let your muscles relax completely before you repeat this exercise. Repeat __________ times. Complete this exercise __________ times a day. Exercise D: Alternating  arm and leg raises  1. Get on your hands and knees on a firm surface. If you are on a hard floor, you may want to use padding to cushion your knees, such as an exercise mat. 2. Line up your arms and legs. Your hands should be below your shoulders, and your knees should be below your hips. 3. Lift your left leg behind you. At the same time, raise your right arm and straighten it in front of you. ? Do not lift your leg higher than your hip. ? Do not lift your arm  higher than your shoulder. ? Keep your abdominal and back muscles tight. ? Keep your hips facing the ground. ? Do not arch your back. ? Keep your balance carefully, and do not hold your breath. 4. Hold for __________ seconds. 5. Slowly return to the starting position and repeat with your right leg and your left arm. Repeat __________ times. Complete this exercise __________ times a day. Exercise E: Abdominal set with straight leg raise  1. Lie on your back on a firm surface. 2. Bend one of your knees and keep your other leg straight. 3. Tense your abdominal muscles and lift your straight leg up, 4-6 inches (10-15 cm) off the ground. 4. Keep your abdominal muscles tight and hold for __________ seconds. ? Do not hold your breath. ? Do not arch your back. Keep it flat against the ground. 5. Keep your abdominal muscles tense as you slowly lower your leg back to the starting position. 6. Repeat with your other leg. Repeat __________ times. Complete this exercise __________ times a day. Posture and body mechanics  Body mechanics refers to the movements and positions of your body while you do your daily activities. Posture is part of body mechanics. Good posture and healthy body mechanics can help to relieve stress in your body's tissues and joints. Good posture means that your spine is in its natural S-curve position (your spine is neutral), your shoulders are pulled back slightly, and your head is not tipped forward. The following are general guidelines for applying improved posture and body mechanics to your everyday activities. Standing   When standing, keep your spine neutral and your feet about hip-width apart. Keep a slight bend in your knees. Your ears, shoulders, and hips should line up.  When you do a task in which you stand in one place for a long time, place one foot up on a stable object that is 2-4 inches (5-10 cm) high, such as a footstool. This helps keep your spine  neutral. Sitting   When sitting, keep your spine neutral and keep your feet flat on the floor. Use a footrest, if necessary, and keep your thighs parallel to the floor. Avoid rounding your shoulders, and avoid tilting your head forward.  When working at a desk or a computer, keep your desk at a height where your hands are slightly lower than your elbows. Slide your chair under your desk so you are close enough to maintain good posture.  When working at a computer, place your monitor at a height where you are looking straight ahead and you do not have to tilt your head forward or downward to look at the screen. Resting   When lying down and resting, avoid positions that are most painful for you.  If you have pain with activities such as sitting, bending, stooping, or squatting (flexion-based activities), lie in a position in which your body does not bend very much. For  example, avoid curling up on your side with your arms and knees near your chest (fetal position).  If you have pain with activities such as standing for a long time or reaching with your arms (extension-based activities), lie with your spine in a neutral position and bend your knees slightly. Try the following positions:  Lying on your side with a pillow between your knees.  Lying on your back with a pillow under your knees. Lifting   When lifting objects, keep your feet at least shoulder-width apart and tighten your abdominal muscles.  Bend your knees and hips and keep your spine neutral. It is important to lift using the strength of your legs, not your back. Do not lock your knees straight out.  Always ask for help to lift heavy or awkward objects. This information is not intended to replace advice given to you by your health care provider. Make sure you discuss any questions you have with your health care provider. Document Released: 02/19/2005 Document Revised: 10/27/2015 Document Reviewed: 12/01/2014 Elsevier  Interactive Patient Education  2018 Elsevier Inc.  

## 2016-12-04 ENCOUNTER — Ambulatory Visit: Payer: 59 | Admitting: Family Medicine

## 2016-12-04 ENCOUNTER — Other Ambulatory Visit: Payer: Self-pay | Admitting: Family Medicine

## 2016-12-04 ENCOUNTER — Encounter: Payer: Self-pay | Admitting: Family Medicine

## 2016-12-04 DIAGNOSIS — E0590 Thyrotoxicosis, unspecified without thyrotoxic crisis or storm: Secondary | ICD-10-CM

## 2016-12-04 LAB — COMPREHENSIVE METABOLIC PANEL
ALT: 31 IU/L (ref 0–32)
AST: 25 IU/L (ref 0–40)
Albumin/Globulin Ratio: 1.2 (ref 1.2–2.2)
Albumin: 3.9 g/dL (ref 3.6–4.8)
Alkaline Phosphatase: 62 IU/L (ref 39–117)
BUN/Creatinine Ratio: 15 (ref 12–28)
BUN: 11 mg/dL (ref 8–27)
Bilirubin Total: 0.4 mg/dL (ref 0.0–1.2)
CALCIUM: 9.4 mg/dL (ref 8.7–10.3)
CO2: 26 mmol/L (ref 20–29)
CREATININE: 0.75 mg/dL (ref 0.57–1.00)
Chloride: 100 mmol/L (ref 96–106)
GFR, EST AFRICAN AMERICAN: 97 mL/min/{1.73_m2} (ref >59)
GFR, EST NON AFRICAN AMERICAN: 84 mL/min/{1.73_m2} (ref >59)
GLUCOSE: 100 mg/dL — AB (ref 65–99)
Globulin, Total: 3.2 g/dL (ref 1.5–4.5)
Potassium: 3.5 mmol/L (ref 3.5–5.2)
Sodium: 140 mmol/L (ref 134–144)
TOTAL PROTEIN: 7.1 g/dL (ref 6.0–8.5)

## 2016-12-04 LAB — TSH

## 2016-12-04 LAB — CBC WITH DIFFERENTIAL/PLATELET
BASOS ABS: 0 10*3/uL (ref 0.0–0.2)
BASOS: 0 %
EOS (ABSOLUTE): 0.2 10*3/uL (ref 0.0–0.4)
Eos: 4 %
Hematocrit: 41.1 % (ref 34.0–46.6)
Hemoglobin: 14.4 g/dL (ref 11.1–15.9)
IMMATURE GRANS (ABS): 0 10*3/uL (ref 0.0–0.1)
IMMATURE GRANULOCYTES: 0 %
Lymphocytes Absolute: 2.1 10*3/uL (ref 0.7–3.1)
Lymphs: 33 %
MCH: 32.6 pg (ref 26.6–33.0)
MCHC: 35 g/dL (ref 31.5–35.7)
MCV: 93 fL (ref 79–97)
MONOCYTES: 13 %
Monocytes Absolute: 0.8 10*3/uL (ref 0.1–0.9)
Neutrophils Absolute: 3.1 10*3/uL (ref 1.4–7.0)
Neutrophils: 50 %
Platelets: 224 10*3/uL (ref 150–379)
RBC: 4.42 x10E6/uL (ref 3.77–5.28)
RDW: 12.3 % (ref 12.3–15.4)
WBC: 6.3 10*3/uL (ref 3.4–10.8)

## 2016-12-04 LAB — LIPID PANEL
CHOL/HDL RATIO: 4.8 ratio — AB (ref 0.0–4.4)
Cholesterol, Total: 206 mg/dL — ABNORMAL HIGH (ref 100–199)
HDL: 43 mg/dL (ref >39)
LDL Calculated: 113 mg/dL — ABNORMAL HIGH (ref 0–99)
Triglycerides: 249 mg/dL — ABNORMAL HIGH (ref 0–149)
VLDL CHOLESTEROL CAL: 50 mg/dL — AB (ref 5–40)

## 2016-12-04 LAB — T4, FREE: Free T4: 1.8 ng/dL — ABNORMAL HIGH (ref 0.82–1.77)

## 2016-12-04 LAB — THYROID PEROXIDASE ANTIBODY: THYROID PEROXIDASE ANTIBODY: 9 [IU]/mL (ref 0–34)

## 2016-12-04 LAB — THYROID STIMULATING IMMUNOGLOBULIN: Thyroid Stim Immunoglobulin: 2.82 IU/L — ABNORMAL HIGH (ref 0.00–0.55)

## 2016-12-04 MED ORDER — CYCLOBENZAPRINE HCL 5 MG PO TABS: 5.0000 mg | ORAL_TABLET | Freq: Every day | ORAL | 3 refills | Status: DC

## 2016-12-04 MED ORDER — DOXYCYCLINE HYCLATE 100 MG PO TABS: ORAL_TABLET | ORAL | 0 refills | Status: DC

## 2016-12-04 MED ORDER — HYDROCHLOROTHIAZIDE 12.5 MG PO TABS: 12.5000 mg | ORAL_TABLET | Freq: Every day | ORAL | 1 refills | Status: DC

## 2016-12-04 NOTE — Progress Notes (Signed)
Patient will call back for results.

## 2016-12-04 NOTE — Telephone Encounter (Signed)
Patient wanted a phone call when her records come in from [place].    She states she has a form that needs to be filled out.

## 2016-12-04 NOTE — Telephone Encounter (Signed)
Discussed with patient at [facility] on 12/03/16.  No previous evaluation by this provider regarding chronic lwoer back pain. ROI signed at visit yesterday. Declined to complete forms at visit.  No further action warranted at this time.

## 2016-12-25 ENCOUNTER — Telehealth: Payer: Self-pay | Admitting: Family Medicine

## 2016-12-25 NOTE — Telephone Encounter (Signed)
Noted.  I will review records this week.

## 2016-12-25 NOTE — Telephone Encounter (Signed)
(  I COULD NOT ATTACH THIS PHONE MESSAGE WITH THE MESSAGE FROM 11/21/16 BECAUSE THE ENCOUNTER HAS BEEN CLOSED). PATIENT STATES SHE WAS CALLED BY ADDIE TO TELL HER THAT HER RECORDS WERE RECEIVED [facility]  FOR DR. Katrinka BlazingSMITH TO REVIEW. PATIENT SAID SHE DROPPED OFF FORMS FROM [employer] HR DEPT. HERE ON 12/14/16 AT [address] THAT NEEDED TO BE COMPLETED BY DR. Katrinka BlazingSMITH BECAUSE THEY HAVE DENIED HER VEHICLE UP-GRADE DUE TO HER BACK WHEN SHE WAS OUT OF TOWN ON COMPANY BUSINESS. SHE SAID HER DEAD LINE TO HAVE THEM COMPLETED IS 01/02/17. SHE WILL HAVE THEM REFAXED AND SHE NEEDS TO  HEAR SOMETHING FROM US BY Wednesday 12/26/16. BEST PHONE [phone] (CELL) MBC

## 2016-12-26 NOTE — Telephone Encounter (Signed)
Pt dropped off her medical exception forms in [address] with a note attached needing Dr. Katrinka BlazingSmith to sign so insurance can pay for her car upgrade due to lower back issues per pt her records are back for Dr. Katrinka BlazingSmith to review.. Put forms in Dr. Katrinka BlazingSmith box.. Pt stated that this is her 2nd time dropping forms off..Marland Kitchen

## 2016-12-27 NOTE — Telephone Encounter (Signed)
Note re: forms sent to Dr. Raynelle FanningSmith [place]

## 2017-01-08 NOTE — Telephone Encounter (Signed)
Informed pt that Dr. Katrinka BlazingSmith will not be filling out paper work for job because her back condition is not chroinc enough . Pt verbalized understanding.

## 2017-01-08 NOTE — Telephone Encounter (Signed)
Please call patient ---- despite her chronic lower back pain, I am not willing to request her company to accommodate her request/ppreference for an SUV for business travel.  I will not be completing her work accommodation forms for this reason.

## 2017-02-06 ENCOUNTER — Ambulatory Visit: Payer: 59 | Admitting: Endocrinology

## 2017-05-09 ENCOUNTER — Other Ambulatory Visit: Payer: Self-pay | Admitting: Family Medicine

## 2017-05-09 DIAGNOSIS — Z889 Allergy status to unspecified drugs, medicaments and biological substances status: Secondary | ICD-10-CM

## 2017-05-10 ENCOUNTER — Telehealth: Payer: Self-pay | Admitting: Family Medicine

## 2017-05-10 NOTE — Telephone Encounter (Signed)
Copied from CRM [id number]. Topic: Quick Communication - Rx Refill/Question >> May 10, 2017  8:14 AM Everardo PacificMoton, Javonta Gronau, [place] wrote: Medication: Doxycycline 100 mg   Has the patient contacted their pharmacy?Yes   Patient calling because she needs a refill on the above medication. Has been in contact with pharmacy but was told she needed to contact her doctors office due to the fact she is using a different pharmacy  Preferred Pharmacy (with phone number or street name): [facility] [phone] [address] [place] [phone]   Agent: Please be advised that RX refills may take up to 3 business days. We ask that you follow-up with your pharmacy.

## 2017-05-10 NOTE — Telephone Encounter (Signed)
Pt requesting refill on doxycycline 100mg  tab.  Last refill was  12/04/16  For 60 tabs  LOV  12/03/16  NOV  06/03/17  Provider: Kevin FentonK. Smith, MD  [facility] [address], [place]  Please review

## 2017-05-14 NOTE — Telephone Encounter (Signed)
Is it ok if I refill her Rx? Please Advise

## 2017-05-19 MED ORDER — DOXYCYCLINE HYCLATE 100 MG PO TABS: ORAL_TABLET | ORAL | 0 refills | Status: DC

## 2017-05-20 NOTE — Telephone Encounter (Signed)
Refill has been sent on 05/19/2017.

## 2017-06-03 ENCOUNTER — Other Ambulatory Visit: Payer: Self-pay

## 2017-06-03 ENCOUNTER — Encounter: Payer: Self-pay | Admitting: Family Medicine

## 2017-06-03 ENCOUNTER — Ambulatory Visit (INDEPENDENT_AMBULATORY_CARE_PROVIDER_SITE_OTHER): Payer: 59 | Admitting: Family Medicine

## 2017-06-03 VITALS — BP 152/89 | HR 98 | Temp 98.4°F | Resp 16 | Ht 66.34 in | Wt 211.8 lb

## 2017-06-03 DIAGNOSIS — J4520 Mild intermittent asthma, uncomplicated: Secondary | ICD-10-CM | POA: Diagnosis not present

## 2017-06-03 DIAGNOSIS — E2839 Other primary ovarian failure: Secondary | ICD-10-CM | POA: Diagnosis not present

## 2017-06-03 DIAGNOSIS — Z0000 Encounter for general adult medical examination without abnormal findings: Secondary | ICD-10-CM

## 2017-06-03 DIAGNOSIS — M797 Fibromyalgia: Secondary | ICD-10-CM

## 2017-06-03 DIAGNOSIS — G2581 Restless legs syndrome: Secondary | ICD-10-CM | POA: Diagnosis not present

## 2017-06-03 DIAGNOSIS — I10 Essential (primary) hypertension: Secondary | ICD-10-CM | POA: Diagnosis not present

## 2017-06-03 DIAGNOSIS — R7302 Impaired glucose tolerance (oral): Secondary | ICD-10-CM

## 2017-06-03 DIAGNOSIS — E0500 Thyrotoxicosis with diffuse goiter without thyrotoxic crisis or storm: Secondary | ICD-10-CM

## 2017-06-03 DIAGNOSIS — M5136 Other intervertebral disc degeneration, lumbar region: Secondary | ICD-10-CM

## 2017-06-03 DIAGNOSIS — E7800 Pure hypercholesterolemia, unspecified: Secondary | ICD-10-CM | POA: Diagnosis not present

## 2017-06-03 DIAGNOSIS — L719 Rosacea, unspecified: Secondary | ICD-10-CM

## 2017-06-03 DIAGNOSIS — Z6833 Body mass index (BMI) 33.0-33.9, adult: Secondary | ICD-10-CM

## 2017-06-03 DIAGNOSIS — E6609 Other obesity due to excess calories: Secondary | ICD-10-CM | POA: Diagnosis not present

## 2017-06-03 DIAGNOSIS — J302 Other seasonal allergic rhinitis: Secondary | ICD-10-CM | POA: Diagnosis not present

## 2017-06-03 DIAGNOSIS — Z889 Allergy status to unspecified drugs, medicaments and biological substances status: Secondary | ICD-10-CM

## 2017-06-03 LAB — POCT URINALYSIS DIP (MANUAL ENTRY)
Bilirubin, UA: NEGATIVE
Glucose, UA: NEGATIVE mg/dL
Ketones, POC UA: NEGATIVE mg/dL
NITRITE UA: NEGATIVE
PROTEIN UA: NEGATIVE mg/dL
RBC UA: NEGATIVE
SPEC GRAV UA: 1.015 (ref 1.010–1.025)
UROBILINOGEN UA: 0.2 U/dL
pH, UA: 7 (ref 5.0–8.0)

## 2017-06-03 NOTE — Progress Notes (Signed)
Subjective:    Patient ID: Mindy Hood, female    DOB: Mar 28, 1950, 67 y.o.   MRN: [id number]  06/03/2017  Annual Exam    HPI This 67 y.o. female presents for COMPLETE PHYSICAL EXAMINATION.  Last physical:  05-26-2014 Pap smear:  N/d hysterectomy Mammogram:  2016; refuses further; does not desire  Witnessed mother go through chemotherapy and radiation.  No quality of life; not willing to go through it. ony has one child has nothing to do with pt.  Colonoscopy:  N/d; refuses; does not want treatment for any cancer.   Bone density:  2012   Visual Acuity Screening   Right eye Left eye Both eyes  Without correction:     With correction: 20/25 20/25 20/25     BP Readings from Last 3 Encounters:  06/03/17 (!) 152/89  12/03/16 134/80  06/05/16 (!) 148/84   Wt Readings from Last 3 Encounters:  06/03/17 211 lb 12.8 oz (96.1 kg)  12/03/16 212 lb (96.2 kg)  06/05/16 213 lb (96.6 kg)   Immunization History  Administered Date(s) Administered  . Tdap 06/17/2009   Graves disease: recently diagnosed; taking Methimazole; cannot tolerate 10mg  dose.  Too harsh on body.  Messing with joints and muscles.  Seeing Dr. Tedd Sias.  Having a hard time with it.  Has been drinking Maranga tea green natural.  Helps body heal.  Became pain free with tea.  Pain recurred when started Methimazole.  Wants to see if numbers have been lowered.  Started Methimazole since 03/2017.  Follow-up in April 06/24/17.  Needs referral to ophthalmology for Graves; having blurred vision every morning for 1 hour.  Dry eyes.  Started in November 2018.  Tremors.  Sweating.   Fibromyalgia:  Pain has greatly worsened with treatment of Graves disease.      Review of Systems  Constitutional: Positive for activity change, appetite change, chills, diaphoresis and fatigue. Negative for fever and unexpected weight change.  HENT: Positive for rhinorrhea and sneezing. Negative for congestion, dental problem, drooling, ear discharge, ear  pain, facial swelling, hearing loss, mouth sores, nosebleeds, postnasal drip, sinus pressure, sore throat, tinnitus, trouble swallowing and voice change.   Eyes: Positive for photophobia, pain, discharge, redness and visual disturbance. Negative for itching.  Respiratory: Positive for apnea and shortness of breath. Negative for cough, choking, chest tightness, wheezing and stridor.   Cardiovascular: Negative for chest pain, palpitations and leg swelling.  Gastrointestinal: Negative for abdominal distention, abdominal pain, anal bleeding, blood in stool, constipation, diarrhea, nausea, rectal pain and vomiting.  Endocrine: Positive for cold intolerance and heat intolerance. Negative for polydipsia, polyphagia and polyuria.  Genitourinary: Positive for frequency. Negative for decreased urine volume, difficulty urinating, dyspareunia, dysuria, enuresis, flank pain, genital sores, hematuria, menstrual problem, pelvic pain, urgency, vaginal bleeding, vaginal discharge and vaginal pain.       Nocturia x 1.  Urinary leakage chronic.  Wears a pad one daily with outings.  Musculoskeletal: Positive for arthralgias, back pain, gait problem, myalgias, neck pain and neck stiffness. Negative for joint swelling.  Skin: Positive for rash. Negative for color change, pallor and wound.  Allergic/Immunologic: Positive for environmental allergies and immunocompromised state. Negative for food allergies.  Neurological: Positive for tremors and speech difficulty. Negative for dizziness, seizures, syncope, facial asymmetry, weakness, light-headedness, numbness and headaches.  Hematological: Negative for adenopathy. Does not bruise/bleed easily.  Psychiatric/Behavioral: Positive for sleep disturbance. Negative for agitation, behavioral problems, confusion, decreased concentration, dysphoric mood, hallucinations, self-injury and suicidal ideas. The patient is not  nervous/anxious and is not hyperactive.        Disrupted sleep  with graves disease. Bedtime 10-11; wakes up 600.    Past Medical History:  Diagnosis Date  . Allergy   . Asthma   . Fibromyalgia 2002   treated for Lyme's disease in 2001.  Luiz Blare. Graves disease   . Hyperlipidemia   . Hypertension   . IBS (irritable bowel syndrome)    diarrhea predominant  . Nephrolithiasis   . Restless leg syndrome    Cyclobenzaprine PRN.  Marland Kitchen. Rosacea   . Urinary, incontinence, stress female    Past Surgical History:  Procedure Laterality Date  . ABDOMINAL HYSTERECTOMY  03/06/1975   ovaries intact.  Vaginal/uterine mass benign with hemorrhage.  Marland Kitchen. CARPAL TUNNEL RELEASE  2006   right hand  . CHOLECYSTECTOMY    . NECK SURGERY  1993   2 disc surgery   Allergies  Allergen Reactions  . Codeine Itching  . Latex Rash  . Penicillins Rash  . Phenergan [Promethazine Hcl] Rash   Current Outpatient Medications on File Prior to Visit  Medication Sig Dispense Refill  . Fish Oil-Cholecalciferol (FISH OIL + D3) 1200-1000 MG-UNIT CAPS Take 1 capsule by mouth daily.    . Multiple Vitamin (MULTI-VITAMIN DAILY PO) Take by mouth.     No current facility-administered medications on file prior to visit.    Social History   Socioeconomic History  . Marital status: Married    Spouse name: Not on file  . Number of children: Not on file  . Years of education: Not on file  . Highest education level: Not on file  Occupational History  . Not on file  Social Needs  . Financial resource strain: Not on file  . Food insecurity:    Worry: Not on file    Inability: Not on file  . Transportation needs:    Medical: Not on file    Non-medical: Not on file  Tobacco Use  . Smoking status: Never Smoker  . Smokeless tobacco: Never Used  Substance and Sexual Activity  . Alcohol use: Yes    Alcohol/week: 0.0 oz    Comment: rarely  . Drug use: No  . Sexual activity: Not on file  Lifestyle  . Physical activity:    Days per week: Not on file    Minutes per session: Not on file  .  Stress: Not on file  Relationships  . Social connections:    Talks on phone: Not on file    Gets together: Not on file    Attends religious service: Not on file    Active member of club or organization: Not on file    Attends meetings of clubs or organizations: Not on file    Relationship status: Not on file  . Intimate partner violence:    Fear of current or ex partner: Not on file    Emotionally abused: Not on file    Physically abused: Not on file    Forced sexual activity: Not on file  Other Topics Concern  . Not on file  Social History Narrative   Marital status: married x  16 years; second marriage; husband is bipolar and alcoholic.         Children: 1 son 15(41); 2 granddaughters but has no relationship with them.      Lives: with husband      Employment:  Works from home full time. 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  Tobacco: none      Alcohol: none      Exercise: none; no exercise since 2015.      Seatbelt: 100%      Guns: none   Family History  Problem Relation Age of Onset  . Diverticulitis Sister   . Hyperlipidemia Sister   . Hypertension Sister   . Fibromyalgia Sister   . Hypertension Sister   . Hyperlipidemia Sister   . Hypertension Mother   . Hyperlipidemia Mother   . Alcohol abuse Mother   . Cancer Mother        tongue/oral, throat, bladder cancer with mets  . Alcohol abuse Father   . Heart disease Father 80       AMI s/p CABG  . Cirrhosis Father        Objective:    BP (!) 152/89   Pulse 98   Temp 98.4 F (36.9 C) (Oral)   Resp 16   Ht 5' 6.34" (1.685 m)   Wt 211 lb 12.8 oz (96.1 kg)   SpO2 95%   BMI 33.84 kg/m  Physical Exam  Constitutional: She is oriented to person, place, and time. She appears well-developed and well-nourished. No distress.  HENT:  Head: Normocephalic and atraumatic.  Right Ear: Hearing, tympanic membrane, external ear and ear canal normal.  Left Ear: Hearing, tympanic membrane, external ear and  ear canal normal.  Nose: Nose normal.  Mouth/Throat: Oropharynx is clear and moist.  Eyes: Pupils are equal, round, and reactive to light. Conjunctivae and EOM are normal.  Bilateral periorbital edema.  Very mild exophthalmos.  Neck: Normal range of motion and full passive range of motion without pain. Neck supple. No JVD present. Carotid bruit is not present. No thyromegaly present.  Cardiovascular: Normal rate, regular rhythm, normal heart sounds and intact distal pulses. Exam reveals no gallop and no friction rub.  No murmur heard. Pulmonary/Chest: Effort normal and breath sounds normal. No respiratory distress. She has no wheezes. She has no rales. Right breast exhibits no inverted nipple, no mass, no nipple discharge, no skin change and no tenderness. Left breast exhibits no inverted nipple, no mass, no nipple discharge, no skin change and no tenderness. Breasts are symmetrical.  Abdominal: Soft. Bowel sounds are normal. She exhibits no distension and no mass. There is no tenderness. There is no rebound and no guarding.  Musculoskeletal:       Right shoulder: Normal.       Left shoulder: Normal.       Cervical back: Normal.  Lymphadenopathy:    She has no cervical adenopathy.  Neurological: She is alert and oriented to person, place, and time. She has normal reflexes. No cranial nerve deficit. She exhibits normal muscle tone. Coordination normal.  Very mild tremor left greater than right upper extremity with extension of arms.  Skin: Skin is warm and dry. No rash noted. She is not diaphoretic. No erythema. No pallor.  Psychiatric: She has a normal mood and affect. Her behavior is normal. Judgment and thought content normal.  Nursing note and vitals reviewed.  No results found. Depression screen [facility] 2/9 06/03/2017 12/03/2016 06/05/2016 06/05/2016 04/12/2015  Decreased Interest 1 0 0 0 0  Down, Depressed, Hopeless 1 0 0 0 0  PHQ - 2 Score 2 0 0 0 0  Altered sleeping 0 - - - -  Tired, decreased  energy 1 - - - -  Change in appetite 0 - - - -  Feeling bad or failure about  yourself  0 - - - -  Trouble concentrating 0 - - - -  Moving slowly or fidgety/restless 0 - - - -  Suicidal thoughts 0 - - - -  PHQ-9 Score 3 - - - -  Difficult doing work/chores Not difficult at all - - - -   Fall Risk  06/03/2017 12/03/2016 06/05/2016 06/05/2016 04/12/2015  Falls in the past year? No No No No No        Assessment & Plan:   1. Routine physical examination   2. Essential hypertension, benign   3. Intermittent asthma without complication, unspecified asthma severity   4. Glucose intolerance (impaired glucose tolerance)   5. Fibromyalgia   6. Pure hypercholesterolemia   7. Restless leg syndrome   8. Estrogen deficiency   9. Graves disease   10. Class 1 obesity due to excess calories with serious comorbidity and body mass index (BMI) of 33.0 to 33.9 in adult   11. Rosacea   12. Other seasonal allergic rhinitis   13. Multiple allergies   14. DDD (degenerative disc disease), lumbar     -anticipatory guidance provided --- exercise, weight loss, safe driving practices, aspirin 81mg  daily. -obtain age appropriate screening labs and labs for chronic disease management. -recommend weight loss, exercise for 30-60 minutes five days per week; recommend 1200 kcal restriction per day with a minimum of 60 grams of protein per day. Graves' disease: Newly diagnosed by endocrinology.  Initiated on methimazole therapy per endocrinology.  Refer to ophthalmology with recent grades diagnosis.  Suffering with periorbital edema. Fibromyalgia: Worsening with recent Graves' disease diagnosis. Allergic rhinitis with asthma: Stable at this time.  No changes to therapy. Rosacea: Stable.  Continue doxycycline as needed.  Avoid environmental triggers. Glucose intolerance: Stable.  Recommend weight loss, exercise, low sugar low carbohydrate food choices. Patient refuses cancer screenings as patient unwilling to undergo  surgery, chemotherapy, radiation for any potential cancer diagnosis.  Refusing mammogram and colonoscopy.    Orders Placed This Encounter  Procedures  . DG Bone Density    Standing Status:   Future    Standing Expiration Date:   08/04/2018    Order Specific Question:   Reason for Exam (SYMPTOM  OR DIAGNOSIS REQUIRED)    Answer:   estrogen deficiency    Order Specific Question:   Preferred imaging location?    Answer:   [facility] [place] [facility]  . CBC with Differential/Platelet  . Comprehensive metabolic panel    Order Specific Question:   Has the patient fasted?    Answer:   No  . Hemoglobin A1c  . Lipid panel    Order Specific Question:   Has the patient fasted?    Answer:   No  . TSH  . T4, Free  . Vitamin B12  . VITAMIN D 25 Hydroxy (Vit-D Deficiency, Fractures)  . VITAMIN D 25 Hydroxy (Vit-D Deficiency, Fractures)  . Vitamin B12  . Ambulatory referral to Ophthalmology    Referral Priority:   Routine    Referral Type:   Consultation    Referral Reason:   Specialty Services Required    Requested Specialty:   Ophthalmology    Number of Visits Requested:   1  . POCT urinalysis dipstick  . EKG 12-Lead   Meds ordered this encounter  Medications  . cetirizine (ZYRTEC) 10 MG tablet    Sig: Take 1 tablet (10 mg total) by mouth daily.    Dispense:  90 tablet    Refill:  3  .  cyclobenzaprine (FLEXERIL) 5 MG tablet    Sig: Take 1 tablet (5 mg total) by mouth at bedtime.    Dispense:  30 tablet    Refill:  3  . doxycycline (VIBRA-TABS) 100 MG tablet    Sig: TAKE 1 TABLET (100 MG TOTAL) BY MOUTH 2 (TWO) TIMES DAILY.    Dispense:  60 tablet    Refill:  5  . hydrochlorothiazide (HYDRODIURIL) 12.5 MG tablet    Sig: Take 1-2 tablets (12.5-25 mg total) by mouth daily.    Dispense:  180 tablet    Refill:  1    Return in about 6 months (around 12/03/2017) for follow-up chronic medical conditions.   Kristi Paulita Fujita, M.D. Primary Care at [facility] [address] [place], [place]  [address] [phone] phone [phone] fax

## 2017-06-03 NOTE — Patient Instructions (Addendum)
   IF you received an x-ray today, you will receive an invoice from [facility]. Please contact [facility] at [phone] with questions or concerns regarding your invoice.   IF you received labwork today, you will receive an invoice from [facility]. Please contact [facility] at [phone] with questions or concerns regarding your invoice.   Our billing staff will not be able to assist you with questions regarding bills from these companies.  You will be contacted with the lab results as soon as they are available. The fastest way to get your results is to activate your My Chart account. Instructions are located on the last page of this paperwork. If you have not heard from us regarding the results in 2 weeks, please contact this office.      Preventive Care 66 Years and Older, Female Preventive care refers to lifestyle choices and visits with your health care provider that can promote health and wellness. What does preventive care include?  A yearly physical exam. This is also called an annual well check.  Dental exams once or twice a year.  Routine eye exams. Ask your health care provider how often you should have your eyes checked.  Personal lifestyle choices, including: ? Daily care of your teeth and gums. ? Regular physical activity. ? Eating a healthy diet. ? Avoiding tobacco and drug use. ? Limiting alcohol use. ? Practicing safe sex. ? Taking low-dose aspirin every day. ? Taking vitamin and mineral supplements as recommended by your health care provider. What happens during an annual well check? The services and screenings done by your health care provider during your annual well check will depend on your age, overall health, lifestyle risk factors, and family history of disease. Counseling Your health care provider may ask you questions about your:  Alcohol use.  Tobacco use.  Drug use.  Emotional well-being.  Home and relationship  well-being.  Sexual activity.  Eating habits.  History of falls.  Memory and ability to understand (cognition).  Work and work environment.  Reproductive health.  Screening You may have the following tests or measurements:  Height, weight, and BMI.  Blood pressure.  Lipid and cholesterol levels. These may be checked every 5 years, or more frequently if you are over 50 years old.  Skin check.  Lung cancer screening. You may have this screening every year starting at age 55 if you have a 30-pack-year history of smoking and currently smoke or have quit within the past 15 years.  Fecal occult blood test (FOBT) of the stool. You may have this test every year starting at age 50.  Flexible sigmoidoscopy or colonoscopy. You may have a sigmoidoscopy every 5 years or a colonoscopy every 10 years starting at age 50.  Hepatitis C blood test.  Hepatitis B blood test.  Sexually transmitted disease (STD) testing.  Diabetes screening. This is done by checking your blood sugar (glucose) after you have not eaten for a while (fasting). You may have this done every 1-3 years.  Bone density scan. This is done to screen for osteoporosis. You may have this done starting at age 65.  Mammogram. This may be done every 1-2 years. Talk to your health care provider about how often you should have regular mammograms.  Talk with your health care provider about your test results, treatment options, and if necessary, the need for more tests. Vaccines Your health care provider may recommend certain vaccines, such as:  Influenza vaccine. This is recommended every year.    Tetanus, diphtheria, and acellular pertussis (Tdap, Td) vaccine. You may need a Td booster every 10 years.  Varicella vaccine. You may need this if you have not been vaccinated.  Zoster vaccine. You may need this after age 60.  Measles, mumps, and rubella (MMR) vaccine. You may need at least one dose of MMR if you were born in  1956 or later. You may also need a second dose.  Pneumococcal 13-valent conjugate (PCV13) vaccine. One dose is recommended after age 65.  Pneumococcal polysaccharide (PPSV23) vaccine. One dose is recommended after age 65.  Meningococcal vaccine. You may need this if you have certain conditions.  Hepatitis A vaccine. You may need this if you have certain conditions or if you travel or work in places where you may be exposed to hepatitis A.  Hepatitis B vaccine. You may need this if you have certain conditions or if you travel or work in places where you may be exposed to hepatitis B.  Haemophilus influenzae type b (Hib) vaccine. You may need this if you have certain conditions.  Talk to your health care provider about which screenings and vaccines you need and how often you need them. This information is not intended to replace advice given to you by your health care provider. Make sure you discuss any questions you have with your health care provider. Document Released: 03/18/2015 Document Revised: 11/09/2015 Document Reviewed: 12/21/2014 Elsevier Interactive Patient Education  2018 Elsevier Inc.  

## 2017-06-04 DIAGNOSIS — M5136 Other intervertebral disc degeneration, lumbar region: Secondary | ICD-10-CM | POA: Insufficient documentation

## 2017-06-04 DIAGNOSIS — E0500 Thyrotoxicosis with diffuse goiter without thyrotoxic crisis or storm: Secondary | ICD-10-CM | POA: Insufficient documentation

## 2017-06-04 LAB — COMPREHENSIVE METABOLIC PANEL
A/G RATIO: 1.2 (ref 1.2–2.2)
ALK PHOS: 69 IU/L (ref 39–117)
ALT: 20 IU/L (ref 0–32)
AST: 20 IU/L (ref 0–40)
Albumin: 4.1 g/dL (ref 3.6–4.8)
BILIRUBIN TOTAL: 0.3 mg/dL (ref 0.0–1.2)
BUN/Creatinine Ratio: 15 (ref 12–28)
BUN: 15 mg/dL (ref 8–27)
CHLORIDE: 103 mmol/L (ref 96–106)
CO2: 20 mmol/L (ref 20–29)
Calcium: 10.2 mg/dL (ref 8.7–10.3)
Creatinine, Ser: 0.99 mg/dL (ref 0.57–1.00)
GFR calc non Af Amer: 60 mL/min/{1.73_m2} (ref >59)
GFR, EST AFRICAN AMERICAN: 69 mL/min/{1.73_m2} (ref >59)
GLUCOSE: 91 mg/dL (ref 65–99)
Globulin, Total: 3.3 g/dL (ref 1.5–4.5)
Potassium: 4.2 mmol/L (ref 3.5–5.2)
Sodium: 146 mmol/L — ABNORMAL HIGH (ref 134–144)
Total Protein: 7.4 g/dL (ref 6.0–8.5)

## 2017-06-04 LAB — CBC WITH DIFFERENTIAL/PLATELET
BASOS ABS: 0 10*3/uL (ref 0.0–0.2)
BASOS: 1 %
EOS (ABSOLUTE): 0.2 10*3/uL (ref 0.0–0.4)
Eos: 3 %
Hematocrit: 44.1 % (ref 34.0–46.6)
Hemoglobin: 15.4 g/dL (ref 11.1–15.9)
Immature Grans (Abs): 0 10*3/uL (ref 0.0–0.1)
Immature Granulocytes: 0 %
Lymphocytes Absolute: 2.1 10*3/uL (ref 0.7–3.1)
Lymphs: 38 %
MCH: 33.2 pg — ABNORMAL HIGH (ref 26.6–33.0)
MCHC: 34.9 g/dL (ref 31.5–35.7)
MCV: 95 fL (ref 79–97)
MONOS ABS: 0.7 10*3/uL (ref 0.1–0.9)
Monocytes: 13 %
NEUTROS ABS: 2.4 10*3/uL (ref 1.4–7.0)
Neutrophils: 45 %
PLATELETS: 253 10*3/uL (ref 150–379)
RBC: 4.64 x10E6/uL (ref 3.77–5.28)
RDW: 14.3 % (ref 12.3–15.4)
WBC: 5.4 10*3/uL (ref 3.4–10.8)

## 2017-06-04 LAB — HEMOGLOBIN A1C
Est. average glucose Bld gHb Est-mCnc: 114 mg/dL
HEMOGLOBIN A1C: 5.6 % (ref 4.8–5.6)

## 2017-06-04 LAB — LIPID PANEL
CHOLESTEROL TOTAL: 278 mg/dL — AB (ref 100–199)
Chol/HDL Ratio: 4.6 ratio — ABNORMAL HIGH (ref 0.0–4.4)
HDL: 61 mg/dL (ref >39)
LDL CALC: 184 mg/dL — AB (ref 0–99)
Triglycerides: 164 mg/dL — ABNORMAL HIGH (ref 0–149)
VLDL Cholesterol Cal: 33 mg/dL (ref 5–40)

## 2017-06-04 LAB — TSH: TSH: 2.23 u[IU]/mL (ref 0.450–4.500)

## 2017-06-04 LAB — VITAMIN D 25 HYDROXY (VIT D DEFICIENCY, FRACTURES): Vit D, 25-Hydroxy: 34.9 ng/mL (ref 30.0–100.0)

## 2017-06-04 LAB — VITAMIN B12: VITAMIN B 12: 1008 pg/mL (ref 232–1245)

## 2017-06-04 LAB — T4, FREE: Free T4: 0.82 ng/dL (ref 0.82–1.77)

## 2017-06-04 MED ORDER — CETIRIZINE HCL 10 MG PO TABS: 10.0000 mg | ORAL_TABLET | Freq: Every day | ORAL | 3 refills | Status: AC

## 2017-06-04 MED ORDER — CYCLOBENZAPRINE HCL 5 MG PO TABS: 5.0000 mg | ORAL_TABLET | Freq: Every day | ORAL | 3 refills | Status: AC

## 2017-06-04 MED ORDER — HYDROCHLOROTHIAZIDE 12.5 MG PO TABS
12.5000 mg | ORAL_TABLET | Freq: Every day | ORAL | 1 refills | Status: AC
Start: 2017-06-04 — End: ?

## 2017-06-04 MED ORDER — DOXYCYCLINE HYCLATE 100 MG PO TABS: ORAL_TABLET | ORAL | 5 refills | Status: AC

## 2017-06-06 ENCOUNTER — Other Ambulatory Visit: Payer: Self-pay | Admitting: Family Medicine

## 2017-06-06 DIAGNOSIS — Z889 Allergy status to unspecified drugs, medicaments and biological substances status: Secondary | ICD-10-CM

## 2017-06-19 ENCOUNTER — Telehealth: Payer: Self-pay | Admitting: Family Medicine

## 2017-06-19 NOTE — Telephone Encounter (Signed)
Copied from CRM [phone]. Topic: General - Other >> Jun 19, 2017  9:47 AM Cecelia ByarsGreen, Temeka L, RMA wrote: Reason for CRM: Marylene LandAngela [facility] is requesting office notes to go along with referral sent, patient is currently at eye center now, please fax office notes to [phone] ATTN: Marylene LandAngela

## 2017-06-19 NOTE — Telephone Encounter (Signed)
Office notes faxed to [facility] 4/17

## 2017-06-20 ENCOUNTER — Other Ambulatory Visit: Payer: Self-pay | Admitting: Family Medicine

## 2017-06-21 NOTE — Telephone Encounter (Signed)
Doxycycline refill request  LOV 06/03/17 with Dr. Sheffield SliderSmith  [facility] [address] - [place], [place] - [id number]

## 2017-06-23 ENCOUNTER — Other Ambulatory Visit: Payer: Self-pay | Admitting: Family Medicine

## 2017-06-27 ENCOUNTER — Other Ambulatory Visit: Payer: Self-pay | Admitting: Family Medicine

## 2017-07-05 ENCOUNTER — Other Ambulatory Visit: Payer: Self-pay | Admitting: Family Medicine

## 2017-07-30 ENCOUNTER — Encounter: Payer: Self-pay | Admitting: Family Medicine

## 2017-09-18 ENCOUNTER — Telehealth: Payer: Self-pay | Admitting: Family Medicine

## 2017-09-18 NOTE — Telephone Encounter (Signed)
Pt called [facility] for this year (one in January and two in April) with Dr[facility] Tedd [facility]iasSolum at [facility]. Pt has [employer] which requires authorization. When we referred pt for Endo in October of 2018, she [facility] which did not require referral. She had been scheduled for follow ups with Endo this year after [facility], but our office was never contacted about these appointments or asked to do a prior auth referral. I called [employer] and spoke with Toby (ref number for call is [phone]) to see if anything can be done and they said referrals can be backdated up to 5 days. However, pt was last seen in April of 2019. They said if it is past 5 days, the specialist office can request a reconsideration. I called Dr. Pricilla HandlerSolum's office and spoke with someone in Collections and explained this. They said they will get this information over to billing. Pt is also scheduled for 7/24 for labs and 7/31 for a visit. I went ahead and got a referral auth to cover these dates. Pt's Berkley Harveyauth number is [id number] and is effective 09/18/17-03/17/18 for 6 months or for 6 visits.  I spoke with pt and she is aware that specialist office is working on reconsideration, and I have done a referral for her next appts. Advised pt if she has more follow ups after she reaches 6 months or 6 visits (whichever comes first) to contact us so we can make sure she has a referral for her appts. Referral auth for upcoming appts has been faxed to [facility] at fax number [phone]. Thanks!

## 2017-09-25 ENCOUNTER — Ambulatory Visit: Payer: 59 | Admitting: Family Medicine

## 2017-09-25 ENCOUNTER — Other Ambulatory Visit: Payer: Self-pay

## 2017-09-25 ENCOUNTER — Encounter: Payer: Self-pay | Admitting: Family Medicine

## 2017-09-25 VITALS — BP 132/82 | HR 102 | Temp 98.0°F | Resp 16 | Ht 66.54 in | Wt 211.0 lb

## 2017-09-25 DIAGNOSIS — M797 Fibromyalgia: Secondary | ICD-10-CM

## 2017-09-25 DIAGNOSIS — I10 Essential (primary) hypertension: Secondary | ICD-10-CM | POA: Diagnosis not present

## 2017-09-25 DIAGNOSIS — E0500 Thyrotoxicosis with diffuse goiter without thyrotoxic crisis or storm: Secondary | ICD-10-CM | POA: Diagnosis not present

## 2017-09-25 DIAGNOSIS — J4520 Mild intermittent asthma, uncomplicated: Secondary | ICD-10-CM

## 2017-09-25 DIAGNOSIS — M5136 Other intervertebral disc degeneration, lumbar region: Secondary | ICD-10-CM

## 2017-09-25 DIAGNOSIS — R7302 Impaired glucose tolerance (oral): Secondary | ICD-10-CM

## 2017-09-25 DIAGNOSIS — M51369 Other intervertebral disc degeneration, lumbar region without mention of lumbar back pain or lower extremity pain: Secondary | ICD-10-CM

## 2017-09-25 DIAGNOSIS — J302 Other seasonal allergic rhinitis: Secondary | ICD-10-CM | POA: Diagnosis not present

## 2017-09-25 MED ORDER — VALACYCLOVIR HCL 1 G PO TABS: 2000.0000 mg | ORAL_TABLET | Freq: Two times a day (BID) | ORAL | 0 refills | Status: AC

## 2017-09-25 MED ORDER — ALBUTEROL SULFATE HFA 108 (90 BASE) MCG/ACT IN AERS: 1.0000 | INHALATION_SPRAY | Freq: Four times a day (QID) | RESPIRATORY_TRACT | 1 refills | Status: AC | PRN

## 2017-09-25 NOTE — Progress Notes (Signed)
Subjective:    Patient ID: Mindy Hood, female    DOB: [birth year], 67 y.o.   MRN: [id number]  09/25/2017  Chronic Conditions (3 month follow-up )    HPI This 67 y.o. female presents for THREE MONTH FOLLOW-UP of hypertension, Graves, fibromyalgia, DDD lumbar spine. Management changes made at last visit include the following: Graves' disease: Newly diagnosed by endocrinology.  Initiated on methimazole therapy per endocrinology.  Refer to ophthalmology with recent grades diagnosis.  Suffering with periorbital edema. Fibromyalgia: Worsening with recent Graves' disease diagnosis. Allergic rhinitis with asthma: Stable at this time.  No changes to therapy. Rosacea: Stable.  Continue doxycycline as needed.  Avoid environmental triggers. Glucose intolerance: Stable.  Recommend weight loss, exercise, low sugar low carbohydrate food choices. Patient refuses cancer screenings as patient unwilling to undergo surgery, chemotherapy, radiation for any potential cancer diagnosis.  Refusing mammogram and colonoscopy.  UPDATE: Doing well at this time; Methimazole dose recently decreased due to hypersomnolence.   Patient reports good compliance with medication, good tolerance to medication, and good symptom control.   Asthma has been poorly controlled this summer with heat and dust exposure; needs Albuterol refill.  No issues in two years.   BP Readings from Last 3 Encounters:  09/25/17 132/82  06/03/17 (!) 152/89  12/03/16 134/80   Wt Readings from Last 3 Encounters:  09/25/17 211 lb (95.7 kg)  06/03/17 211 lb 12.8 oz (96.1 kg)  12/03/16 212 lb (96.2 kg)   Immunization History  Administered Date(s) Administered  . Tdap 06/17/2009    Review of Systems  Constitutional: Negative for activity change, appetite change, chills, diaphoresis, fatigue, fever and unexpected weight change.  HENT: Negative for congestion, dental problem, drooling, ear discharge, ear pain, facial swelling, hearing loss,  mouth sores, nosebleeds, postnasal drip, rhinorrhea, sinus pressure, sneezing, sore throat, tinnitus, trouble swallowing and voice change.   Eyes: Negative for photophobia, pain, discharge, redness, itching and visual disturbance.  Respiratory: Negative for apnea, cough, choking, chest tightness, shortness of breath, wheezing and stridor.   Cardiovascular: Negative for chest pain, palpitations and leg swelling.  Gastrointestinal: Negative for abdominal distention, abdominal pain, anal bleeding, blood in stool, constipation, diarrhea, nausea, rectal pain and vomiting.  Endocrine: Negative for cold intolerance, heat intolerance, polydipsia, polyphagia and polyuria.  Genitourinary: Negative for decreased urine volume, difficulty urinating, dyspareunia, dysuria, enuresis, flank pain, frequency, genital sores, hematuria, menstrual problem, pelvic pain, urgency, vaginal bleeding, vaginal discharge and vaginal pain.  Musculoskeletal: Negative for arthralgias, back pain, gait problem, joint swelling, myalgias, neck pain and neck stiffness.  Skin: Negative for color change, pallor, rash and wound.  Allergic/Immunologic: Negative for environmental allergies, food allergies and immunocompromised state.  Neurological: Negative for dizziness, tremors, seizures, syncope, facial asymmetry, speech difficulty, weakness, light-headedness, numbness and headaches.  Hematological: Negative for adenopathy. Does not bruise/bleed easily.  Psychiatric/Behavioral: Negative for agitation, behavioral problems, confusion, decreased concentration, dysphoric mood, hallucinations, self-injury, sleep disturbance and suicidal ideas. The patient is not nervous/anxious and is not hyperactive.     Past Medical History:  Diagnosis Date  . Allergy   . Asthma   . Fibromyalgia 2002   treated for Lyme's disease in 2001.  Luiz Blare disease   . Hyperlipidemia   . Hypertension   . IBS (irritable bowel syndrome)    diarrhea predominant    . Nephrolithiasis   . Restless leg syndrome    Cyclobenzaprine PRN.  Marland Kitchen Rosacea   . Urinary, incontinence, stress female    Past Surgical History:  Procedure  Laterality Date  . ABDOMINAL HYSTERECTOMY  03/06/1975   ovaries intact.  Vaginal/uterine mass benign with hemorrhage.  Marland Kitchen CARPAL TUNNEL RELEASE  2006   right hand  . CHOLECYSTECTOMY    . NECK SURGERY  1993   2 disc surgery   Allergies  Allergen Reactions  . Codeine Itching  . Latex Rash  . Penicillins Rash  . Phenergan [Promethazine Hcl] Rash   Current Outpatient Medications on File Prior to Visit  Medication Sig Dispense Refill  . cetirizine (ZYRTEC) 10 MG tablet Take 1 tablet (10 mg total) by mouth daily. 90 tablet 3  . cyclobenzaprine (FLEXERIL) 5 MG tablet Take 1 tablet (5 mg total) by mouth at bedtime. 30 tablet 3  . doxycycline (VIBRA-TABS) 100 MG tablet TAKE 1 TABLET (100 MG TOTAL) BY MOUTH 2 (TWO) TIMES DAILY. 60 tablet 5  . Fish Oil-Cholecalciferol (FISH OIL + D3) 1200-1000 MG-UNIT CAPS Take 1 capsule by mouth daily.    . hydrochlorothiazide (HYDRODIURIL) 12.5 MG tablet Take 1-2 tablets (12.5-25 mg total) by mouth daily. 180 tablet 1  . methimazole (TAPAZOLE) 5 MG tablet Take 5 mg by mouth 3 (three) times daily.    . Multiple Vitamin (MULTI-VITAMIN DAILY PO) Take by mouth.     No current facility-administered medications on file prior to visit.    Social History   Socioeconomic History  . Marital status: Married    Spouse name: Not on file  . Number of children: Not on file  . Years of education: Not on file  . Highest education level: Not on file  Occupational History  . Not on file  Social Needs  . Financial resource strain: Not on file  . Food insecurity:    Worry: Not on file    Inability: Not on file  . Transportation needs:    Medical: Not on file    Non-medical: Not on file  Tobacco Use  . Smoking status: Never Smoker  . Smokeless tobacco: Never Used  Substance and Sexual Activity  . Alcohol  use: Yes    Alcohol/week: 0.0 oz    Comment: rarely  . Drug use: No  . Sexual activity: Not on file  Lifestyle  . Physical activity:    Days per week: Not on file    Minutes per session: Not on file  . Stress: Not on file  Relationships  . Social connections:    Talks on phone: Not on file    Gets together: Not on file    Attends religious service: Not on file    Active member of club or organization: Not on file    Attends meetings of clubs or organizations: Not on file    Relationship status: Not on file  . Intimate partner violence:    Fear of current or ex partner: Not on file    Emotionally abused: Not on file    Physically abused: Not on file    Forced sexual activity: Not on file  Other Topics Concern  . Not on file  Social History Narrative   Marital status: married x  16 years; second marriage; husband is bipolar and alcoholic.         Children: 1 son (48); 2 granddaughters but has no relationship with them.      Lives: with husband      Employment:  Works from home full time. Works for [employer]; enters Radiation protection practitioner; desk job.      Tobacco: none      Alcohol:  none      Exercise: none; no exercise since 2015.      Seatbelt: 100%      Guns: none   Family History  Problem Relation Age of Onset  . Diverticulitis Sister   . Hyperlipidemia Sister   . Hypertension Sister   . Fibromyalgia Sister   . Hypertension Sister   . Hyperlipidemia Sister   . Hypertension Mother   . Hyperlipidemia Mother   . Alcohol abuse Mother   . Cancer Mother        tongue/oral, throat, bladder cancer with mets  . Alcohol abuse Father   . Heart disease Father 576       AMI s/p CABG  . Cirrhosis Father        Objective:    BP 132/82   Pulse (!) 102   Temp 98 F (36.7 C) (Oral)   Resp 16   Ht 5' 6.54" (1.69 m)   Wt 211 lb (95.7 kg)   SpO2 97%   BMI 33.51 kg/m  Physical Exam  Constitutional: She is oriented to person, place, and time. She appears well-developed and  well-nourished. No distress.  HENT:  Head: Normocephalic and atraumatic.  Right Ear: External ear normal.  Left Ear: External ear normal.  Nose: Nose normal.  Mouth/Throat: Oropharynx is clear and moist.  Eyes: Pupils are equal, round, and reactive to light. Conjunctivae and EOM are normal.  Neck: Normal range of motion and full passive range of motion without pain. Neck supple. No JVD present. Carotid bruit is not present. No thyromegaly present.  Cardiovascular: Normal rate, regular rhythm, normal heart sounds and intact distal pulses. Exam reveals no gallop and no friction rub.  No murmur heard. Pulmonary/Chest: Effort normal and breath sounds normal. She has no wheezes. She has no rales.  Abdominal: Soft. Bowel sounds are normal. She exhibits no distension and no mass. There is no tenderness. There is no rebound and no guarding.  Musculoskeletal:       Right shoulder: Normal.       Left shoulder: Normal.       Cervical back: Normal.  Lymphadenopathy:    She has no cervical adenopathy.  Neurological: She is alert and oriented to person, place, and time. She has normal reflexes. No cranial nerve deficit. She exhibits normal muscle tone. Coordination normal.  Skin: Skin is warm and dry. No rash noted. She is not diaphoretic. No erythema. No pallor.  Psychiatric: She has a normal mood and affect. Her behavior is normal. Judgment and thought content normal.  Nursing note and vitals reviewed.  No results found. Depression screen [facility] 2/9 09/25/2017 06/03/2017 12/03/2016 06/05/2016 06/05/2016  Decreased Interest 0 1 0 0 0  Down, Depressed, Hopeless 0 1 0 0 0  PHQ - 2 Score 0 2 0 0 0  Altered sleeping - 0 - - -  Tired, decreased energy - 1 - - -  Change in appetite - 0 - - -  Feeling bad or failure about yourself  - 0 - - -  Trouble concentrating - 0 - - -  Moving slowly or fidgety/restless - 0 - - -  Suicidal thoughts - 0 - - -  PHQ-9 Score - 3 - - -  Difficult doing work/chores - Not  difficult at all - - -   Fall Risk  09/25/2017 06/03/2017 12/03/2016 06/05/2016 06/05/2016  Falls in the past year? No No No No No        Assessment & Plan:  1. Essential hypertension, benign   2. Intermittent asthma without complication, unspecified asthma severity   3. Other seasonal allergic rhinitis   4. Graves disease   5. Glucose intolerance (impaired glucose tolerance)   6. DDD (degenerative disc disease), lumbar   7. Fibromyalgia     Controlled chronic medical conditions.  Graves disease slowly improving and well controlled other than eye related symptoms; followed by ophthalmology closely.   No orders of the defined types were placed in this encounter.  Meds ordered this encounter  Medications  . albuterol (PROVENTIL HFA;VENTOLIN HFA) 108 (90 Base) MCG/ACT inhaler    Sig: Inhale 1-2 puffs into the lungs every 6 (six) hours as needed for wheezing or shortness of breath (cough, shortness of breath or wheezing.).    Dispense:  1 Inhaler    Refill:  1    Dispense albuterol HFA that insurance prefers.  . valACYclovir (VALTREX) 1000 MG tablet    Sig: Take 2 tablets (2,000 mg total) by mouth 2 (two) times daily. For two doses PRN outbreaks    Dispense:  40 tablet    Refill:  0    No follow-ups on file.   Vesna Kable Paulita Fujita, M.D. [facility] [address] [place], [place]  [address] [phone] phone [phone] fax

## 2017-09-25 NOTE — Patient Instructions (Signed)
     IF you received an x-ray today, you will receive an invoice from [facility]. Please contact [facility] at [phone] with questions or concerns regarding your invoice.   IF you received labwork today, you will receive an invoice from [facility]. Please contact [facility] at [phone] with questions or concerns regarding your invoice.   Our billing staff will not be able to assist you with questions regarding bills from these companies.  You will be contacted with the lab results as soon as they are available. The fastest way to get your results is to activate your My Chart account. Instructions are located on the last page of this paperwork. If you have not heard from us regarding the results in 2 weeks, please contact this office.     

## 2017-10-09 ENCOUNTER — Encounter: Payer: Self-pay | Admitting: Family Medicine

## 2017-11-20 ENCOUNTER — Other Ambulatory Visit: Payer: Self-pay | Admitting: Family Medicine

## 2017-11-20 NOTE — Telephone Encounter (Signed)
Cyclobenzaprine 5 mg refill Last Refill:10/21/17 # 30 Last OV: 09/25/17 PCP: former Katrinka BlazingSmith- none Pharmacy:[facility]/ [address]  Not filled: muscle relaxant not on protocol- patient not established with another provider

## 2017-12-11 ENCOUNTER — Ambulatory Visit: Payer: 59 | Admitting: Family Medicine

## 2017-12-20 ENCOUNTER — Other Ambulatory Visit: Payer: Self-pay | Admitting: Family Medicine

## 2021-08-11 ENCOUNTER — Other Ambulatory Visit: Payer: Self-pay | Admitting: Family Medicine

## 2021-08-11 DIAGNOSIS — Z0000 Encounter for general adult medical examination without abnormal findings: Secondary | ICD-10-CM

## 2021-08-11 DIAGNOSIS — E2839 Other primary ovarian failure: Secondary | ICD-10-CM

## 2021-09-18 ENCOUNTER — Ambulatory Visit
Admission: RE | Admit: 2021-09-18 | Discharge: 2021-09-18 | Disposition: A | Discharge status: Home / Self Care | Payer: No Typology Code available for payment source | Source: Ambulatory Visit | Attending: Family Medicine | Admitting: Family Medicine

## 2021-09-18 DIAGNOSIS — E2839 Other primary ovarian failure: Secondary | ICD-10-CM | POA: Insufficient documentation

## 2021-09-18 DIAGNOSIS — Z0000 Encounter for general adult medical examination without abnormal findings: Secondary | ICD-10-CM | POA: Diagnosis present
